# Patient Record
Sex: Female | Born: 1955 | Race: White | Hispanic: No | Marital: Married | Smoking: Never smoker
Health system: Southern US, Community
[De-identification: ages and names within clinical notes are randomized; demographics above are authoritative.]

## PROBLEM LIST (undated history)

## (undated) DIAGNOSIS — M1990 Unspecified osteoarthritis, unspecified site: Secondary | ICD-10-CM

## (undated) DIAGNOSIS — K219 Gastro-esophageal reflux disease without esophagitis: Secondary | ICD-10-CM

## (undated) DIAGNOSIS — Z87442 Personal history of urinary calculi: Secondary | ICD-10-CM

## (undated) DIAGNOSIS — Z87448 Personal history of other diseases of urinary system: Secondary | ICD-10-CM

## (undated) DIAGNOSIS — R059 Cough, unspecified: Secondary | ICD-10-CM

## (undated) DIAGNOSIS — I10 Essential (primary) hypertension: Secondary | ICD-10-CM

## (undated) DIAGNOSIS — K449 Diaphragmatic hernia without obstruction or gangrene: Secondary | ICD-10-CM

## (undated) DIAGNOSIS — R05 Cough: Secondary | ICD-10-CM

## (undated) HISTORY — PX: EXTRACORPOREAL SHOCK WAVE LITHOTRIPSY: SHX1557

---

## 2005-03-23 ENCOUNTER — Other Ambulatory Visit: Admission: RE | Admit: 2005-03-23 | Discharge: 2005-03-23 | Payer: Self-pay | Admitting: Obstetrics and Gynecology

## 2005-07-15 ENCOUNTER — Encounter: Admission: RE | Admit: 2005-07-15 | Discharge: 2005-07-15 | Payer: Self-pay | Admitting: Thoracic Surgery

## 2005-08-18 ENCOUNTER — Ambulatory Visit: Payer: Self-pay | Admitting: Internal Medicine

## 2005-09-17 ENCOUNTER — Ambulatory Visit: Payer: Self-pay | Admitting: Internal Medicine

## 2005-10-12 ENCOUNTER — Ambulatory Visit: Payer: Self-pay | Admitting: Internal Medicine

## 2005-10-20 ENCOUNTER — Ambulatory Visit: Payer: Self-pay | Admitting: Internal Medicine

## 2005-12-14 HISTORY — PX: KNEE ARTHROSCOPY: SUR90

## 2005-12-14 HISTORY — PX: LAPAROSCOPIC CHOLECYSTECTOMY: SUR755

## 2006-02-26 ENCOUNTER — Ambulatory Visit (HOSPITAL_COMMUNITY): Admission: RE | Admit: 2006-02-26 | Discharge: 2006-02-26 | Payer: Self-pay | Admitting: Internal Medicine

## 2006-03-15 ENCOUNTER — Ambulatory Visit: Payer: Self-pay | Admitting: Internal Medicine

## 2006-03-18 ENCOUNTER — Ambulatory Visit: Payer: Self-pay | Admitting: Internal Medicine

## 2007-03-25 ENCOUNTER — Other Ambulatory Visit: Admission: RE | Admit: 2007-03-25 | Discharge: 2007-03-25 | Payer: Self-pay | Admitting: Obstetrics and Gynecology

## 2008-08-15 ENCOUNTER — Other Ambulatory Visit: Admission: RE | Admit: 2008-08-15 | Discharge: 2008-08-15 | Payer: Self-pay | Admitting: Obstetrics and Gynecology

## 2010-11-06 ENCOUNTER — Emergency Department (HOSPITAL_COMMUNITY): Admission: EM | Admit: 2010-11-06 | Discharge: 2010-11-07 | Payer: Self-pay | Admitting: Emergency Medicine

## 2010-11-28 ENCOUNTER — Telehealth: Payer: Self-pay | Admitting: Internal Medicine

## 2010-11-28 ENCOUNTER — Encounter: Payer: Self-pay | Admitting: Nurse Practitioner

## 2010-12-03 ENCOUNTER — Ambulatory Visit: Payer: Self-pay | Admitting: Gastroenterology

## 2011-01-12 ENCOUNTER — Other Ambulatory Visit (HOSPITAL_COMMUNITY)
Admission: RE | Admit: 2011-01-12 | Discharge: 2011-01-12 | Disposition: A | Discharge status: Home / Self Care | Payer: BC Managed Care – PPO | Source: Ambulatory Visit | Attending: Family Medicine | Admitting: Family Medicine

## 2011-01-12 ENCOUNTER — Other Ambulatory Visit: Payer: Self-pay | Admitting: Family Medicine

## 2011-01-12 DIAGNOSIS — Z124 Encounter for screening for malignant neoplasm of cervix: Secondary | ICD-10-CM | POA: Insufficient documentation

## 2011-01-15 NOTE — Progress Notes (Signed)
Summary: Reflux  Phone Note Call from Patient Call back at [phone]   Call For: Dr Juanda Chance Summary of Call: Reflux is so bad she feels she is chocking. Wonders if she can be seen sooner than feb. PA ok Initial call taken by: Leanor Kail [facility],  November 28, 2010 2:18 PM  Follow-up for Phone Call        Message left for patient to call back.Jesse Fall RN  November 28, 2010 2:31 PM Spoke with patient. She has been experiencing acid reflux. This started in August. She saw her PCP and has not gotten better. States chest feels tight and can't breath at times. This is worse after eating. States she has a lot of acid in her throat. Takes Prilosec two times a day without relief. Scheduled patient with Willette Cluster, RNP on 12/03/10 at 9 AM. New patient letter sent. Follow-up by: Jesse Fall RN,  November 28, 2010 2:56 PM

## 2011-01-15 NOTE — Letter (Signed)
Summary: New Patient letter  [facility]  [address] [place], [place] [address]   Phone: [phone]  Fax: [phone]       11/28/2010 MRN: [id number]  Morgan Anthony [address] [place], [place]  [address]  Dear Ms. Morgan Anthony,  Welcome to the Gastroenterology Division at [facility].    You are scheduled to see Willette Cluster, RN on 12/03/10 at  9:00 AM  on the 3rd floor at [facility], [address]  We ask that you try to arrive at our office 15 minutes prior to your appointment time to allow for check-in.  We would like you to complete the enclosed self-administered evaluation form prior to your visit and bring it with you on the day of your appointment.  We will review it with you.  Also, please bring a complete list of all your medications or, if you prefer, bring the medication bottles and we will list them.  Please bring your insurance card so that we may make a copy of it.  If your insurance requires a referral to see a specialist, please bring your referral form from your primary care physician.  Co-payments are due at the time of your visit and may be paid by cash, check or credit card.     Your office visit will consist of a consult with your physician (includes a physical exam), any laboratory testing he/she may order, scheduling of any necessary diagnostic testing (e.g. x-ray, ultrasound, CT-scan), and scheduling of a procedure (e.g. Endoscopy, Colonoscopy) if required.  Please allow enough time on your schedule to allow for any/all of these possibilities.    If you cannot keep your appointment, please call [phone] to cancel or reschedule prior to your appointment date.  This allows [place] the opportunity to schedule an appointment for another patient in need of care.  If you do not cancel or reschedule by 5 p.m. the business day prior to your appointment date, you will be charged a $50.00 late cancellation/no-show fee.    Thank you for  choosing [facility] for your medical needs.  We appreciate the opportunity to care for you.  Please visit [place] at our website  to learn more about our practice.                     Sincerely,                                                             The Gastroenterology Division

## 2011-01-15 NOTE — Procedures (Signed)
Summary: Colonoscopy   Patient Name: Morgan Anthony, Morgan Anthony. MRN:  Procedure Procedures: Colonoscopy CPT: 2175713837.  Personnel: Endoscopist: Dora L. Juanda Chance, MD.  Exam Location: Exam performed in Outpatient Clinic. Outpatient  Patient Consent: Procedure, Alternatives, Risks and Benefits discussed, consent obtained, from patient. Consent was obtained by the RN.  Indications Symptoms: Abdominal pain / bloating.  History  Current Medications: Patient is not currently taking Coumadin.  Pre-Exam Physical: Performed Sep 17, 2005. Entire physical exam was normal.  Exam Exam: Extent of exam reached: Cecum, extent intended: Cecum.  The cecum was identified by appendiceal orifice and IC valve. Colon retroflexion performed. Images taken. ASA Classification: I. Tolerance: good.  Monitoring: Pulse and BP monitoring, Oximetry used. Supplemental O2 given.  Colon Prep Used Miralax for colon prep. Prep results: good.  Sedation Meds: Patient assessed and found to be appropriate for moderate (conscious) sedation. Fentanyl 50 mcg. given IV. Versed 7 mg. given IV.  Findings - NORMAL EXAM: Cecum.  - NORMAL EXAM: Rectum.   Assessment Normal examination.  Comments: abd pain has resolved after pt put on Nexiem 40 mg qd, pain subsided after 2 days Events  Unplanned Interventions: No intervention was required.  Unplanned Events: There were no complications. Plans Patient Education: Patient given standard instructions for: Yearly hemoccult testing recommended. Patient instructed to get routine colonoscopy every 10 years.  Comments: follow up with Dr Kevan Ny Disposition: After procedure patient sent to recovery. After recovery patient sent home.   cc:  Duncan Dull, MD  This report was created from the original endoscopy report, which was reviewed and signed by the above listed endoscopist.

## 2011-01-15 NOTE — Procedures (Signed)
Summary: EGD   Patient Name: Morgan Anthony, Morgan Anthony. MRN:  Procedure Procedures: Panendoscopy (EGD) CPT: 43235.    with biopsy(s)/brushing(s). CPT: D1846139.  Personnel: Endoscopist: Dora L. Juanda Chance, MD.  Exam Location: Exam performed in Outpatient Clinic. Outpatient  Patient Consent: Procedure, Alternatives, Risks and Benefits discussed, consent obtained, from patient. Consent was obtained by the RN.  Indications  Abnormal Exams, Studies: Xray, abnormal, do not suspect malignancy.  Symptoms: Abdominal pain, location: RUQ. Reflux symptoms for 1-5 yrs,  History  Current Medications: Patient is not currently taking Coumadin.  Pre-Exam Physical: Performed Mar 18, 2006  Entire physical exam was normal.  Comments: Pt. history reviewed/updated, physical exam performed prior to initiation of sedation? Exam Exam Info: Maximum depth of insertion Duodenum, intended Duodenum. Vocal cords visualized. Gastric retroflexion performed. Images taken. ASA Classification: I. Tolerance: excellent.  Sedation Meds: Patient assessed and found to be appropriate for moderate (conscious) sedation. Fentanyl 50 mcg. given IV. Versed 5 mg. given IV. Cetacaine Spray 2 sprays given aerosolized.  Monitoring: BP and pulse monitoring done. Oximetry used. Supplemental O2 given  Findings - HIATAL HERNIA: Diaphragm 40 cm from mouth. Z-line/GE Junction 35 cm from mouth. Regular, 5 cms. in length. nonreducible. ICD9: Hernia, Hiatal: 553.3. DIAGNOSTIC TEST: from Antrum. RUT done, results pending  Normal: Duodenal Apex.   Assessment Abnormal examination, see findings above.  Diagnoses: 553.3: Hernia, Hiatal.   Comments: moderate size hiatal hernia, no other structural abnormality Events  Unplanned Intervention: No unplanned interventions were required.  Unplanned Events: There were no complications. Plans Medication(s): Await pathology. PPI: Esomeprazole/Nexium 40 mg QD, starting Mar 18, 2006    Comments: nothing to explain RMQ abd. pain,  abnormal HIDA scan with decreased ejection fraction and  reproduction of pt's pain with fatty meal, refer to a surgeon Disposition: After procedure patient sent to recovery. After recovery patient sent home.   cc: Duncan Dull, MD       Lorne Skeens. Hoxworth, MD  This report was created from the original endoscopy report, which was reviewed and signed by the above listed endoscopist.

## 2011-02-24 LAB — POCT CARDIAC MARKERS: Troponin i, poc: <0.05 ng/mL (ref 0.00–0.09)

## 2011-02-24 LAB — POCT I-STAT, CHEM 8
Calcium, Ion: 1.03 mmol/L — ABNORMAL LOW (ref 1.12–1.32)
Glucose, Bld: 115 mg/dL — ABNORMAL HIGH (ref 70–99)
HCT: 45 % (ref 36.0–46.0)
Hemoglobin: 15.3 g/dL — ABNORMAL HIGH (ref 12.0–15.0)
TCO2: 21 mmol/L (ref 0–100)

## 2011-02-24 LAB — DIFFERENTIAL
Basophils Relative: 0 % (ref 0–1)
Eosinophils Absolute: 0.1 10*3/uL (ref 0.0–0.7)
Eosinophils Relative: 2 % (ref 0–5)
Monocytes Absolute: 0.5 10*3/uL (ref 0.1–1.0)
Monocytes Relative: 8 % (ref 3–12)

## 2011-02-24 LAB — CBC
HCT: 43.1 % (ref 36.0–46.0)
Hemoglobin: 15.2 g/dL — ABNORMAL HIGH (ref 12.0–15.0)
MCH: 31.1 pg (ref 26.0–34.0)
MCHC: 35.3 g/dL (ref 30.0–36.0)

## 2014-03-19 ENCOUNTER — Other Ambulatory Visit (HOSPITAL_COMMUNITY)
Admission: RE | Admit: 2014-03-19 | Discharge: 2014-03-19 | Disposition: A | Discharge status: Home / Self Care | Payer: BC Managed Care – PPO | Source: Ambulatory Visit | Attending: Family Medicine | Admitting: Family Medicine

## 2014-03-19 ENCOUNTER — Other Ambulatory Visit: Payer: Self-pay | Admitting: Family Medicine

## 2014-03-19 DIAGNOSIS — Z124 Encounter for screening for malignant neoplasm of cervix: Secondary | ICD-10-CM | POA: Insufficient documentation

## 2014-03-19 DIAGNOSIS — Z1151 Encounter for screening for human papillomavirus (HPV): Secondary | ICD-10-CM | POA: Insufficient documentation

## 2014-09-28 ENCOUNTER — Other Ambulatory Visit (HOSPITAL_COMMUNITY)
Admission: RE | Admit: 2014-09-28 | Discharge: 2014-09-28 | Disposition: A | Discharge status: Home / Self Care | Payer: BC Managed Care – PPO | Source: Ambulatory Visit | Attending: Family Medicine | Admitting: Family Medicine

## 2014-09-28 ENCOUNTER — Other Ambulatory Visit: Payer: Self-pay | Admitting: Family Medicine

## 2014-09-28 DIAGNOSIS — Z124 Encounter for screening for malignant neoplasm of cervix: Secondary | ICD-10-CM | POA: Diagnosis not present

## 2014-10-02 LAB — CYTOLOGY - PAP

## 2015-03-21 ENCOUNTER — Other Ambulatory Visit: Payer: Self-pay | Admitting: Family Medicine

## 2015-03-22 LAB — CYTOLOGY - PAP

## 2015-07-19 ENCOUNTER — Other Ambulatory Visit: Payer: Self-pay | Admitting: Urology

## 2015-07-24 ENCOUNTER — Encounter (HOSPITAL_COMMUNITY): Payer: Self-pay | Admitting: *Deleted

## 2015-07-24 NOTE — H&P (Deleted)
History of Present Illness F/u - PCP Dr. Darcus Austin     1 - urethritis - h/o urethral dilation up to 30 Fr. These are no longer performed. She also has a history of urethritis. She self treats with trimethoprim x 1 maybe every 1-2 weeks.   -2014 - no bothersome LUTS. One UTI tx with abx. PVR 0 ml.   -Jun 2016 - self treats with TMP 1-2 weekly depending on sxs of "itching". Is using Vagifem twice weekly. No UTI in 2 yrs.     2- MH - Jun 2016 - MH noted on UA assoc with low back pain radiating into right hip/groin but feels this is related to picking up grandson. Hx of stone > 20 yrs ago. Denies gross hematuria.       August 2016 interval history  The patient returns and continued management of microscopic hematuria associated with back pain. She underwent CT hematuria protocol which revealed an 11 mm right UPJ to right lower pole stone (11 mm, HU 1048, SSD 8.8 cm, visible on scout). There is also 6.6 cm simple right upper pole cyst.    Today, the pain in her back is improved after orthopedic injection of steroid but she still has some occasional right flank pain. She's had no gross hematuria or dysuria.     Past Medical History Problems  1. History of Arthritis 2. History of heartburn (Z87.898) 3. History of hypertension (Z86.79) 4. History of kidney stones (N46.270)  Surgical History Problems  1. History of Gallbladder Surgery 2. History of Knee Surgery 3. History of Lithotripsy  Current Meds 1. ALPRAZolam 0.5 MG Oral Tablet; TAKE 1 TABLET EVERY 8 HOURS AS NEEDED FOR  ANXIETY;  Therapy: 11Oct2012 to Recorded 2. Atenolol 50 MG Oral Tablet;  Therapy: (Recorded:21Jun2016) to Recorded 3. Celecoxib 200 MG Oral Capsule;  Therapy: (Recorded:21Jun2016) to Recorded 4. Cephalexin 500 MG Oral Capsule; TAKE 1 CAPSULE Daily;  Therapy: 21Jun2016 to (Evaluate:01Jul2016)  Requested for: 21Jun2016; Last  Rx:21Jun2016 Ordered 5. NexIUM 40 MG Oral Capsule Delayed Release;  Therapy: (Recorded:02Jan2013) to Recorded 6. Vagifem 10 MCG Vaginal Tablet;  Therapy: (Recorded:21Jun2016) to Recorded 7. Vitamin D 50000 UNIT CAPS;  Therapy: (Recorded:21Jun2016) to Recorded  Allergies Medication  1. Cozaar TABS 2. Trimethoprim TABS  Family History Problems  1. Family history of Death of family member : Father 2. Family history of Family Health Status Number Of Children   3 sons 3. Family history of arthritis (Z82.61) : Mother 4. Family history of hypertension (Z82.49) : Mother, Father 5. Family history of pulmonary fibrosis (Z83.6) : Father 6. Family history of Melanoma : Father  Social History Problems  1. Denied: History of Alcohol use 2. Caffeine Use   2 3. Marital History - Currently Married 4. Never A Smoker 5. Retired  Engineer, site Vital Signs [Data Includes: Last 1 Day]  Recorded: 04Aug2016 03:07PM  Blood Pressure: 133 / 88 Temperature: 97.5 F Heart Rate: 78  Physical Exam Constitutional: Well nourished and well developed . No acute distress.  Pulmonary: No respiratory distress and normal respiratory rhythm and effort.  Cardiovascular: Heart rate and rhythm are normal . No peripheral edema.  Neuro/Psych:. Mood and affect are appropriate.    Results/Data Urine [Data Includes: Last 1 Day]   [id number]  COLOR YELLOW   APPEARANCE CLEAR   SPECIFIC GRAVITY 1.015   pH 6.0   GLUCOSE NEGATIVE   BILIRUBIN NEGATIVE   KETONE NEGATIVE   BLOOD 3+   PROTEIN NEGATIVE   NITRITE NEGATIVE  LEUKOCYTE ESTERASE NEGATIVE   SQUAMOUS EPITHELIAL/HPF 0-5 HPF  WBC NONE SEEN WBC/HPF  RBC 10-20 RBC/HPF  BACTERIA NONE SEEN HPF  CRYSTALS NONE SEEN HPF  CASTS NONE SEEN LPF  Yeast NONE SEEN HPF   Old records or history reviewed:[name] Kitchen  The following images/tracing/specimen were independently visualized: [name] Kitchen    Assessment Assessed  1. Microscopic hematuria (R31.2) 2. Nephrolithiasis (N20.0) 3. Urethritis (N34.2)  Plan Health Maintenance  1. UA With REFLEX; [Do  Not Release]; Status:Complete;   Done: [id number] 02:55PM Nephrolithiasis  2. Follow-up Schedule Surgery Office  Follow-up  Status: Hold For - Appointment   Requested for: 04Aug2016 Urethritis  3. Start: Cephalexin 250 MG Oral Capsule; Take 1 capsule twice a day for 3 days when  necessary dysuria 4. URINE CULTURE; Status:Hold For - Specimen/Data Collection,Appointment; Requested  for:04Aug2016;   Discussion/Summary Nephrolithiasis-I reviewed with the patient the nature risks benefits of continued surveillance, right extracorporeal shockwave lithotripsy her right ureteroscopy. All questions answered. She had shockwave lithotripsy in the past would like to proceed with that again. She did say she passed a rather large fragment last time which made her "sick". We did discuss risk of bleeding infection and failure to fragment, failure to pass fragments, need for staged procedures among others. All questions answered. Given the stone size location and Hounsfield units as well as the skin to stone distance I believe she has a good chance of success with shockwave lithotripsy. Urine sent for culture as a precaution.    Urethritis is-refilled cephalexin 250 mg and instructed to take minimal amount. Notify me if she gets symptoms that don't resolve.    Microscopic hematuria-likely related to renal stone but discussed importance of follow-up in the long run to ensure hematuria resolves. She may need cystoscopy.

## 2015-07-25 ENCOUNTER — Ambulatory Visit (HOSPITAL_COMMUNITY): Payer: BC Managed Care – PPO

## 2015-07-25 ENCOUNTER — Ambulatory Visit (HOSPITAL_COMMUNITY)
Admission: RE | Admit: 2015-07-25 | Discharge: 2015-07-25 | Disposition: A | Discharge status: Home / Self Care | Payer: BC Managed Care – PPO | Source: Ambulatory Visit | Attending: Urology | Admitting: Urology

## 2015-07-25 ENCOUNTER — Encounter (HOSPITAL_COMMUNITY): Payer: Self-pay | Admitting: *Deleted

## 2015-07-25 ENCOUNTER — Encounter (HOSPITAL_COMMUNITY)
Admission: RE | Disposition: A | Discharge status: Home / Self Care | Payer: Self-pay | Source: Ambulatory Visit | Attending: Urology

## 2015-07-25 DIAGNOSIS — N342 Other urethritis: Secondary | ICD-10-CM | POA: Insufficient documentation

## 2015-07-25 DIAGNOSIS — Z888 Allergy status to other drugs, medicaments and biological substances status: Secondary | ICD-10-CM | POA: Insufficient documentation

## 2015-07-25 DIAGNOSIS — N200 Calculus of kidney: Secondary | ICD-10-CM | POA: Diagnosis not present

## 2015-07-25 DIAGNOSIS — I10 Essential (primary) hypertension: Secondary | ICD-10-CM | POA: Insufficient documentation

## 2015-07-25 DIAGNOSIS — R109 Unspecified abdominal pain: Secondary | ICD-10-CM | POA: Diagnosis not present

## 2015-07-25 DIAGNOSIS — Z87442 Personal history of urinary calculi: Secondary | ICD-10-CM | POA: Insufficient documentation

## 2015-07-25 DIAGNOSIS — Z79899 Other long term (current) drug therapy: Secondary | ICD-10-CM | POA: Diagnosis not present

## 2015-07-25 DIAGNOSIS — R312 Other microscopic hematuria: Secondary | ICD-10-CM | POA: Diagnosis present

## 2015-07-25 HISTORY — DX: Essential (primary) hypertension: I10

## 2015-07-25 HISTORY — DX: Gastro-esophageal reflux disease without esophagitis: K21.9

## 2015-07-25 SURGERY — LITHOTRIPSY, ESWL
Anesthesia: LOCAL | Laterality: Right

## 2015-07-25 MED ORDER — SODIUM CHLORIDE 0.9 % IV SOLN
INTRAVENOUS | Status: DC
  Administered 2015-07-25: 10:00:00 via INTRAVENOUS

## 2015-07-25 MED ORDER — DIPHENHYDRAMINE HCL 25 MG PO CAPS
25.0000 mg | ORAL_CAPSULE | ORAL | Status: AC
  Administered 2015-07-25: 25 mg via ORAL
  Filled 2015-07-25: qty 1

## 2015-07-25 MED ORDER — CIPROFLOXACIN HCL 500 MG PO TABS
500.0000 mg | ORAL_TABLET | ORAL | Status: AC
  Administered 2015-07-25: 500 mg via ORAL
  Filled 2015-07-25: qty 1

## 2015-07-25 MED ORDER — DIAZEPAM 5 MG PO TABS
10.0000 mg | ORAL_TABLET | ORAL | Status: AC
  Administered 2015-07-25: 10 mg via ORAL
  Filled 2015-07-25: qty 2

## 2015-07-25 NOTE — H&P (Signed)
History of Present Illness F/u - PCP Dr. Darcus Austin     1 - urethritis - h/o urethral dilation up to 30 Fr. These are no longer performed. She also has a history of urethritis. She self treats with trimethoprim x 1 maybe every 1-2 weeks.   -2014 - no bothersome LUTS. One UTI tx with abx. PVR 0 ml.   -Jun 2016 - self treats with TMP 1-2 weekly depending on sxs of "itching". Is using Vagifem twice weekly. No UTI in 2 yrs.     2- MH - Jun 2016 - MH noted on UA assoc with low back pain radiating into right hip/groin but feels this is related to picking up grandson. Hx of stone > 20 yrs ago. Denies gross hematuria.       August 2016 interval history  The patient returns and continued management of microscopic hematuria associated with back pain. She underwent CT hematuria protocol which revealed an 11 mm right UPJ to right lower pole stone (11 mm, HU 1048, SSD 8.8 cm, visible on scout). There is also 6.6 cm simple right upper pole cyst.    Today, the pain in her back is improved after orthopedic injection of steroid but she still has some occasional right flank pain. She's had no gross hematuria or dysuria.     Past Medical History Problems  1. History of Arthritis 2. History of heartburn (Z87.898) 3. History of hypertension (Z86.79) 4. History of kidney stones (D32.671)  Surgical History Problems  1. History of Gallbladder Surgery 2. History of Knee Surgery 3. History of Lithotripsy  Current Meds 1. ALPRAZolam 0.5 MG Oral Tablet; TAKE 1 TABLET EVERY 8 HOURS AS NEEDED FOR  ANXIETY;  Therapy: 11Oct2012 to Recorded 2. Atenolol 50 MG Oral Tablet;  Therapy: (Recorded:21Jun2016) to Recorded 3. Celecoxib 200 MG Oral Capsule;  Therapy: (Recorded:21Jun2016) to Recorded 4. Cephalexin 500 MG Oral Capsule; TAKE 1 CAPSULE Daily;  Therapy: 21Jun2016 to (Evaluate:01Jul2016)  Requested for: 21Jun2016; Last  Rx:21Jun2016 Ordered 5. NexIUM 40 MG Oral Capsule Delayed Release;  Therapy: (Recorded:02Jan2013) to Recorded 6. Vagifem 10 MCG Vaginal Tablet;  Therapy: (Recorded:21Jun2016) to Recorded 7. Vitamin D 50000 UNIT CAPS;  Therapy: (Recorded:21Jun2016) to Recorded  Allergies Medication  1. Cozaar TABS 2. Trimethoprim TABS  Family History Problems  1. Family history of Death of family member : Father 2. Family history of Family Health Status Number Of Children   3 sons 3. Family history of arthritis (Z82.61) : Mother 4. Family history of hypertension (Z82.49) : Mother, Father 5. Family history of pulmonary fibrosis (Z83.6) : Father 6. Family history of Melanoma : Father  Social History Problems  1. Denied: History of Alcohol use 2. Caffeine Use   2 3. Marital History - Currently Married 4. Never A Smoker 5. Retired  Engineer, site Vital Signs [Data Includes: Last 1 Day]  Recorded: 04Aug2016 03:07PM  Blood Pressure: 133 / 88 Temperature: 97.5 F Heart Rate: 78  Physical Exam Constitutional: Well nourished and well developed . No acute distress.  Pulmonary: No respiratory distress and normal respiratory rhythm and effort.  Cardiovascular: Heart rate and rhythm are normal . No peripheral edema.  Neuro/Psych:. Mood and affect are appropriate.    Results/Data Urine [Data Includes: Last 1 Day]   [id number]  COLOR YELLOW   APPEARANCE CLEAR   SPECIFIC GRAVITY 1.015   pH 6.0   GLUCOSE NEGATIVE   BILIRUBIN NEGATIVE   KETONE NEGATIVE   BLOOD 3+   PROTEIN NEGATIVE   NITRITE NEGATIVE  LEUKOCYTE ESTERASE NEGATIVE   SQUAMOUS EPITHELIAL/HPF 0-5 HPF  WBC NONE SEEN WBC/HPF  RBC 10-20 RBC/HPF  BACTERIA NONE SEEN HPF  CRYSTALS NONE SEEN HPF  CASTS NONE SEEN LPF  Yeast NONE SEEN HPF   Old records or history reviewed:[name] Kitchen  The following images/tracing/specimen were independently visualized: [name] Kitchen    Assessment Assessed  1. Microscopic hematuria (R31.2) 2. Nephrolithiasis (N20.0) 3. Urethritis (N34.2)  Plan Health Maintenance  1. UA With REFLEX; [Do  Not Release]; Status:Complete;   Done: [id number] 02:55PM Nephrolithiasis  2. Follow-up Schedule Surgery Office  Follow-up  Status: Hold For - Appointment   Requested for: 04Aug2016 Urethritis  3. Start: Cephalexin 250 MG Oral Capsule; Take 1 capsule twice a day for 3 days when  necessary dysuria 4. URINE CULTURE; Status:Hold For - Specimen/Data Collection,Appointment; Requested  for:04Aug2016;   Discussion/Summary Nephrolithiasis-I reviewed with the patient the nature risks benefits of continued surveillance, right extracorporeal shockwave lithotripsy her right ureteroscopy. All questions answered. She had shockwave lithotripsy in the past would like to proceed with that again. She did say she passed a rather large fragment last time which made her "sick". We did discuss risk of bleeding infection and failure to fragment, failure to pass fragments, need for staged procedures among others. All questions answered. Given the stone size location and Hounsfield units as well as the skin to stone distance I believe she has a good chance of success with shockwave lithotripsy. Urine sent for culture as a precaution.    Urethritis is-refilled cephalexin 250 mg and instructed to take minimal amount. Notify me if she gets symptoms that don't resolve.    Microscopic hematuria-likely related to renal stone but discussed importance of follow-up in the long run to ensure hematuria resolves. She may need cystoscopy.

## 2015-07-25 NOTE — Op Note (Signed)
Refer to [facility] Op Note scanned in the chart 

## 2015-10-02 ENCOUNTER — Encounter: Payer: Self-pay | Admitting: Gastroenterology

## 2016-03-13 IMAGING — CR DG ABDOMEN 1V
1 series · 1 of 1 positions shown · non-contrast
Comparison: CT scan dated 06/12/2015

CLINICAL DATA: Right kidney stone.

EXAM:
ABDOMEN - 1 VIEW

[t abdomen supine]
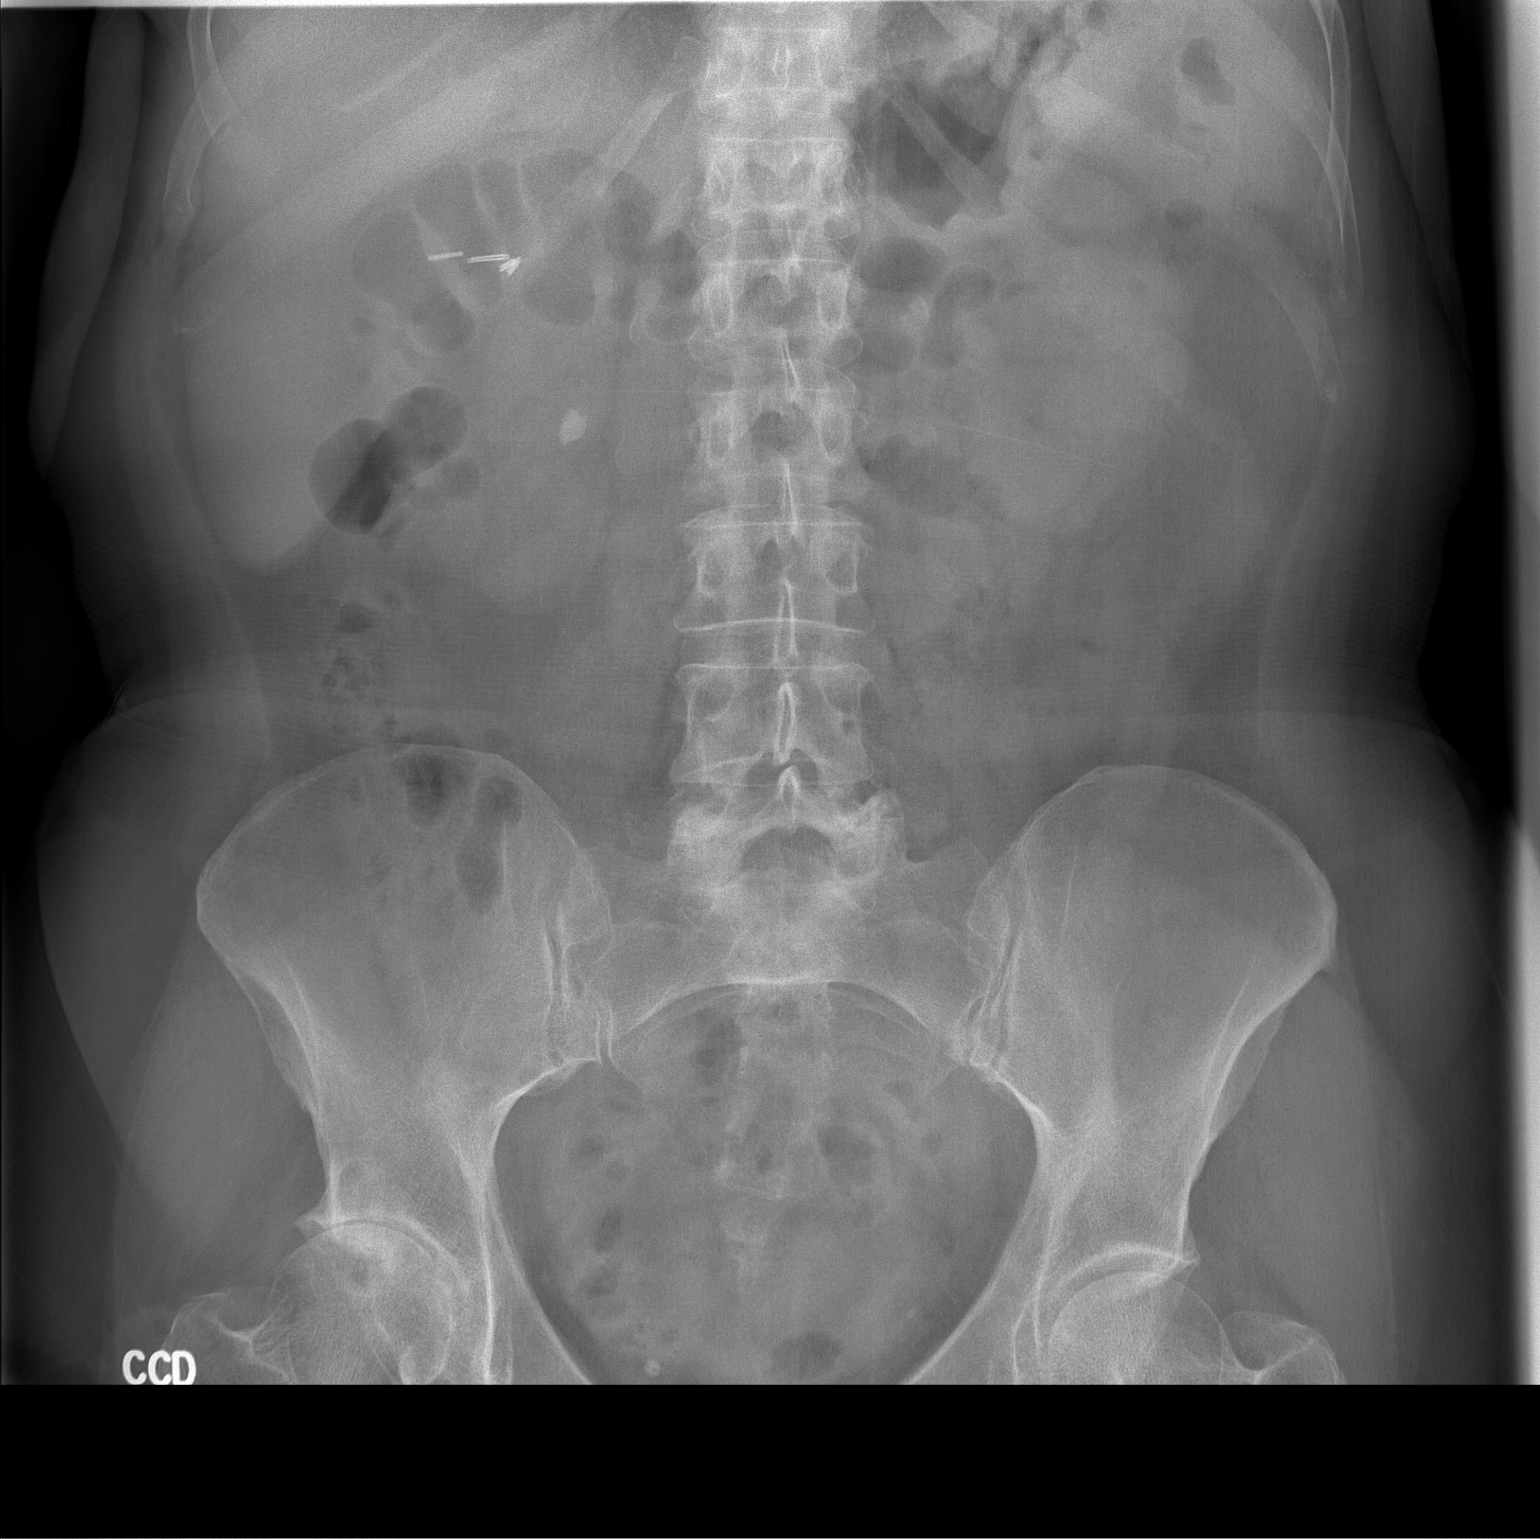

[1 of 1 positions shown; findings below may reference images not displayed]

FINDINGS: 10 mm stone is again noted in the right renal pelvis. Phleboliths in
the pelvis. Bowel gas pattern is normal. Surgical clips from
previous cholecystectomy.

Moderate arthritis of the right hip.  Facet arthritis at L5-S1.
IMPRESSION: 10 mm stone in the right renal pelvis, unchanged.

## 2018-06-20 ENCOUNTER — Other Ambulatory Visit: Payer: Self-pay | Admitting: Family Medicine

## 2018-06-20 ENCOUNTER — Other Ambulatory Visit (HOSPITAL_COMMUNITY)
Admission: RE | Admit: 2018-06-20 | Discharge: 2018-06-20 | Disposition: A | Discharge status: Home / Self Care | Payer: BC Managed Care – PPO | Source: Ambulatory Visit | Attending: Family Medicine | Admitting: Family Medicine

## 2018-06-20 DIAGNOSIS — Z01411 Encounter for gynecological examination (general) (routine) with abnormal findings: Secondary | ICD-10-CM | POA: Diagnosis not present

## 2018-06-21 LAB — CYTOLOGY - PAP: Diagnosis: NEGATIVE

## 2018-10-17 ENCOUNTER — Encounter: Payer: Self-pay | Admitting: Gastroenterology

## 2018-12-05 ENCOUNTER — Encounter: Payer: BC Managed Care – PPO | Admitting: Gastroenterology

## 2018-12-19 ENCOUNTER — Encounter: Payer: Self-pay | Admitting: Gastroenterology

## 2018-12-19 ENCOUNTER — Ambulatory Visit (AMBULATORY_SURGERY_CENTER): Payer: Self-pay

## 2018-12-19 VITALS — Ht 63.0 in | Wt 142.0 lb

## 2018-12-19 DIAGNOSIS — Z1211 Encounter for screening for malignant neoplasm of colon: Secondary | ICD-10-CM

## 2018-12-19 MED ORDER — NA SULFATE-K SULFATE-MG SULF 17.5-3.13-1.6 GM/177ML PO SOLN: 1.0000 | Freq: Once | ORAL | 0 refills | Status: AC

## 2018-12-19 NOTE — Progress Notes (Signed)
Denies allergies to eggs or soy products. Denies complication of anesthesia or sedation. Denies use of weight loss medication. Denies use of O2.   Emmi instructions declined.   Patient was given a 15.00 coupon for Suprep.

## 2019-01-02 ENCOUNTER — Encounter: Payer: Self-pay | Admitting: Gastroenterology

## 2019-01-02 ENCOUNTER — Ambulatory Visit (AMBULATORY_SURGERY_CENTER): Payer: BC Managed Care – PPO | Admitting: Gastroenterology

## 2019-01-02 VITALS — BP 135/69 | HR 58 | Temp 98.0°F | Resp 17 | Ht 63.0 in | Wt 142.0 lb

## 2019-01-02 DIAGNOSIS — K635 Polyp of colon: Secondary | ICD-10-CM

## 2019-01-02 DIAGNOSIS — Z1211 Encounter for screening for malignant neoplasm of colon: Secondary | ICD-10-CM | POA: Diagnosis not present

## 2019-01-02 DIAGNOSIS — D124 Benign neoplasm of descending colon: Secondary | ICD-10-CM

## 2019-01-02 MED ORDER — SODIUM CHLORIDE 0.9 % IV SOLN: 500.0000 mL | Freq: Once | INTRAVENOUS | Status: DC

## 2019-01-02 NOTE — Op Note (Signed)
Morgan Anthony Patient Name: Morgan Anthony Procedure Date: 01/02/2019 9:59 AM MRN: [id number] Endoscopist: Remo Lipps P. Havery Moros , MD Age: 63 Referring MD:  Date of Birth: 07-Oct-1956 Gender: Female Account #: [id number] Procedure:                Colonoscopy Indications:              Screening for colorectal malignant neoplasm Medicines:                Monitored Anesthesia Care Procedure:                Pre-Anesthesia Assessment:                           - Prior to the procedure, a History and Physical                            was performed, and patient medications and                            allergies were reviewed. The patient's tolerance of                            previous anesthesia was also reviewed. The risks                            and benefits of the procedure and the sedation                            options and risks were discussed with the patient.                            All questions were answered, and informed consent                            was obtained. Prior Anticoagulants: The patient has                            taken no previous anticoagulant or antiplatelet                            agents. ASA Grade Assessment: II - A patient with                            mild systemic disease. After reviewing the risks                            and benefits, the patient was deemed in                            satisfactory condition to undergo the procedure.                           After obtaining informed consent, the colonoscope  was passed under direct vision. Throughout the                            procedure, the patient's blood pressure, pulse, and                            oxygen saturations were monitored continuously. The                            Model PCF-H190DL [id number]) scope was introduced                            through the anus and advanced to the the cecum,                            identified by  appendiceal orifice and ileocecal                            valve. The colonoscopy was performed without                            difficulty. The patient tolerated the procedure                            well. The quality of the bowel preparation was                            good. The ileocecal valve, appendiceal orifice, and                            rectum were photographed. Scope In: 10:11:13 AM Scope Out: 10:24:29 AM Scope Withdrawal Time: 0 hours 10 minutes 43 seconds  Total Procedure Duration: 0 hours 13 minutes 16 seconds  Findings:                 The perianal and digital rectal examinations were                            normal.                           A 3 mm polyp was found in the descending colon. The                            polyp was flat. The polyp was removed with a cold                            snare. Resection and retrieval were complete.                           A few small-mouthed diverticula were found in the                            sigmoid colon.  The exam was otherwise without abnormality. Complications:            No immediate complications. Estimated blood loss:                            Minimal. Estimated Blood Loss:     Estimated blood loss was minimal. Impression:               - One 3 mm polyp in the descending colon, removed                            with a cold snare. Resected and retrieved.                           - Diverticulosis in the sigmoid colon.                           - The examination was otherwise normal. Recommendation:           - Patient has a contact number available for                            emergencies. The signs and symptoms of potential                            delayed complications were discussed with the                            patient. Return to normal activities tomorrow.                            Written discharge instructions were provided to the                             patient.                           - Resume previous diet.                           - Continue present medications.                           - Await pathology results. Remo Lipps P. Frankey Botting, MD 01/02/2019 10:35:43 AM This report has been signed electronically.

## 2019-01-02 NOTE — Progress Notes (Signed)
Called to room to assist during endoscopic procedure.  Patient ID and intended procedure confirmed with present staff. Received instructions for my participation in the procedure from the performing physician.  

## 2019-01-02 NOTE — Progress Notes (Signed)
Report to PACU, RN, vss, BBS= Clear.  

## 2019-01-02 NOTE — Patient Instructions (Signed)
Impression/Recommendations:  [place] handout given to patient. Diverticulosis handout given to patient.  Resume previous diet. Continue present medications.  Await pathology results.  YOU HAD AN ENDOSCOPIC PROCEDURE TODAY AT [facility]:   Refer to the procedure report that was given to you for any specific questions about what was found during the examination.  If the procedure report does not answer your questions, please call your gastroenterologist to clarify.  If you requested that your care partner not be given the details of your procedure findings, then the procedure report has been included in a sealed envelope for you to review at your convenience later.  YOU SHOULD EXPECT: Some feelings of bloating in the abdomen. Passage of more gas than usual.  Walking can help get rid of the air that was put into your GI tract during the procedure and reduce the bloating. If you had a lower endoscopy (such as a colonoscopy or flexible sigmoidoscopy) you may notice spotting of blood in your stool or on the toilet paper. If you underwent a bowel prep for your procedure, you may not have a normal bowel movement for a few days.  Please Note:  You might notice some irritation and congestion in your nose or some drainage.  This is from the oxygen used during your procedure.  There is no need for concern and it should clear up in a day or so.  SYMPTOMS TO REPORT IMMEDIATELY:   Following lower endoscopy (colonoscopy or flexible sigmoidoscopy):  Excessive amounts of blood in the stool  Significant tenderness or worsening of abdominal pains  Swelling of the abdomen that is new, acute  Fever of 100F or higher For urgent or emergent issues, a gastroenterologist can be reached at any hour by calling [phone].   DIET:  We do recommend a small meal at first, but then you may proceed to your regular diet.  Drink plenty of fluids but you should avoid alcoholic beverages for 24  hours.  ACTIVITY:  You should plan to take it easy for the rest of today and you should NOT DRIVE or use heavy machinery until tomorrow (because of the sedation medicines used during the test).    FOLLOW UP: Our staff will call the number listed on your records the next business day following your procedure to check on you and address any questions or concerns that you may have regarding the information given to you following your procedure. If we do not reach you, we will leave a message.  However, if you are feeling well and you are not experiencing any problems, there is no need to return our call.  We will assume that you have returned to your regular daily activities without incident.  If any biopsies were taken you will be contacted by phone or by letter within the next 1-3 weeks.  Please call us at [phone] if you have not heard about the biopsies in 3 weeks.    SIGNATURES/CONFIDENTIALITY: You and/or your care partner have signed paperwork which will be entered into your electronic medical record.  These signatures attest to the fact that that the information above on your After Visit Summary has been reviewed and is understood.  Full responsibility of the confidentiality of this discharge information lies with you and/or your care-partner.

## 2019-01-03 ENCOUNTER — Telehealth: Payer: Self-pay

## 2019-01-03 NOTE — Telephone Encounter (Signed)
  Follow up Call-  Call back number 01/02/2019  Post procedure Call Back phone  # [phone]  Permission to leave phone message Yes  Some recent data might be hidden     Patient questions:  Do you have a fever, pain , or abdominal swelling? No. Pain Score  0 *  Have you tolerated food without any problems? Yes.    Have you been able to return to your normal activities? Yes.    Do you have any questions about your discharge instructions: Diet   No. Medications  No. Follow up visit  No.  Do you have questions or concerns about your Care? No.  Actions: * If pain score is 4 or above: No action needed, pain <4.

## 2019-01-10 ENCOUNTER — Encounter: Payer: Self-pay | Admitting: Gastroenterology

## 2019-01-14 HISTORY — PX: COLONOSCOPY WITH PROPOFOL: SHX5780

## 2019-01-18 ENCOUNTER — Ambulatory Visit (INDEPENDENT_AMBULATORY_CARE_PROVIDER_SITE_OTHER): Payer: BC Managed Care – PPO | Admitting: Orthopaedic Surgery

## 2019-01-18 ENCOUNTER — Ambulatory Visit (INDEPENDENT_AMBULATORY_CARE_PROVIDER_SITE_OTHER): Payer: Self-pay

## 2019-01-18 DIAGNOSIS — M1611 Unilateral primary osteoarthritis, right hip: Secondary | ICD-10-CM | POA: Diagnosis not present

## 2019-01-18 DIAGNOSIS — M25551 Pain in right hip: Secondary | ICD-10-CM

## 2019-01-18 NOTE — Progress Notes (Signed)
Office Visit Note   Patient: Morgan Anthony           Date of Birth: 12-19-1955           MRN: [id number] Visit Date: 01/18/2019              Requested by: Chesley Noon, MD [place], [place] [address] PCP: Chesley Noon, MD   Assessment & Plan: Visit Diagnoses:  1. Pain in right hip   2. Unilateral primary osteoarthritis, right hip     Plan: I went over the patient's x-rays in detail with her.  Also her clinical exam findings.  I do feel that she has end-stage arthritis and needs a hip replacement surgery at this point as the definitive treatment for her pain and stiffness.  She understands that as well.  She has put up with this for many years now and is getting towards totally detrimentally affecting her quality of life.  She takes care of grandkids daily.  She needs to be more active and have less pain.  Given the severity of her x-ray findings I do feel a total hip arthroplasty is warranted of the right hip.  I showed her hip model explained in detail with the surgery involves.  We talked about the risk and benefits of surgery.  We discussed intraoperative and postoperative course and what to expect.  She is interested in having this scheduled soon.  All questions concerns were answered and addressed.  Follow-Up Instructions: Return for 2 weeks post-op.   Orders:  Orders Placed This Encounter  Procedures  . XR HIP UNILAT W OR W/O PELVIS 1V RIGHT   No orders of the defined types were placed in this encounter.     Procedures: No procedures performed   Clinical Data: No additional findings.   Subjective: Chief Complaint  Patient presents with  . Right Hip - Pain  Patient is a very pleasant 63 year old female who was sent this way to evaluate and treat severe end-stage arthritis of her right hip.  Her pain is daily and it is 10 out of 10.  She gets very stiff with the right hip.  She now has trouble getting her shoes and socks on.  She does take care  of several grandkids daily and now that is affecting what she does.  Her mobility is significant limited.  She has significant pain in her groin.  At this point this is been going on for many years.  She is not a diabetic and is not a smoker.  She is petite.  She is tried and failed all forms conservative treatment including activity modification, anti-inflammatories, injections, walking with a can and rest.  HPI  Review of Systems She currently denies any headache, chest pain, shortness of breath, fever, chills, nausea, vomiting  Objective: Vital Signs: There were no vitals taken for this visit.  Physical Exam She is alert and orient x3 and in no acute distress Ortho Exam Examination of her right hip shows profound stiffness in her right hip.  She also has a leg length discrepancy with the right side shorter than left.  She has severe pain over the extremes of rotation of the right hip.  Her left hip exam is entirely normal. Specialty Comments:  No specialty comments available.  Imaging: Xr Hip Unilat W Or W/o Pelvis 1v Right  Result Date: 01/18/2019 An AP pelvis and lateral right hip shows profound end-stage arthritis of the right hip.  There is a leg length discrepancy with the right side shorter than left.  There is complete loss of the joint space.  There are sclerotic changes in the femoral head and acetabulum as well as large para-articular osteophytes and cystic changes.  There is collapse and flattening of the femoral head.    PMFS History: Patient Active Problem List   Diagnosis Date Noted  . Unilateral primary osteoarthritis, right hip 01/18/2019   Past Medical History:  Diagnosis Date  . Arthritis    knees and hips  . Chronic kidney disease    right renal stone  . GERD (gastroesophageal reflux disease)   . History of hiatal hernia   . Hypertension     Family History  Problem Relation Age of Onset  . Colon cancer Neg Hx   . Esophageal cancer Neg Hx   . Rectal  cancer Neg Hx   . Stomach cancer Neg Hx     Past Surgical History:  Procedure Laterality Date  . CESAREAN SECTION  1985  . CHOLECYSTECTOMY  2007  . [place]  . KNEE ARTHROSCOPY Bilateral 2007   Social History   Occupational History  . Not on file  Tobacco Use  . Smoking status: Never Smoker  . Smokeless tobacco: Never Used  Substance and Sexual Activity  . Alcohol use: No  . Drug use: No  . Sexual activity: Not on file

## 2019-01-31 ENCOUNTER — Other Ambulatory Visit (INDEPENDENT_AMBULATORY_CARE_PROVIDER_SITE_OTHER): Payer: Self-pay | Admitting: Physician Assistant

## 2019-02-02 ENCOUNTER — Telehealth (INDEPENDENT_AMBULATORY_CARE_PROVIDER_SITE_OTHER): Payer: Self-pay | Admitting: Orthopaedic Surgery

## 2019-02-02 NOTE — Telephone Encounter (Signed)
Needs to call her primary care doc, he won't know what to call in for her

## 2019-02-02 NOTE — Telephone Encounter (Signed)
Pt called stating she is suppose to be having surgery on the 28th. But the pt says she has a cold its in her chest and she can't get it to break up. She is wondering if Dr.Blackman can call her in something.

## 2019-02-03 NOTE — Patient Instructions (Signed)
Morgan Anthony  02/03/2019   Your procedure is scheduled on: 02-10-2019   Report to [facility] Main  Entrance    Report to admitting at 9:30AM    Call this number if you have problems the morning of surgery [phone]      Remember: Do not eat food or drink liquids :After Midnight. BRUSH YOUR TEETH MORNING OF SURGERY AND RINSE YOUR MOUTH OUT, NO CHEWING GUM CANDY OR MINTS.     Take these medicines the morning of surgery with A SIP OF WATER: ATENOLOL, ESOMEPRAZOLE, TYLENOL                                 You may not have any metal on your body including hair pins and              piercings  Do not wear jewelry, make-up, lotions, powders or perfumes, deodorant             Do not wear nail polish.  Do not shave  48 hours prior to surgery.               Do not bring valuables to the hospital. [place].  Contacts, dentures or bridgework may not be worn into surgery.  Leave suitcase in the car. After surgery it may be brought to your room.                 Please read over the following fact sheets you were given: _____________________________________________________________________             [facility] - Preparing for Surgery Before surgery, you can play an important role.  Because skin is not sterile, your skin needs to be as free of germs as possible.  You can reduce the number of germs on your skin by washing with CHG (chlorahexidine gluconate) soap before surgery.  CHG is an antiseptic cleaner which kills germs and bonds with the skin to continue killing germs even after washing. Please DO NOT use if you have an allergy to CHG or antibacterial soaps.  If your skin becomes reddened/irritated stop using the CHG and inform your nurse when you arrive at Short Stay. Do not shave (including legs and underarms) for at least 48 hours prior to the first CHG shower.  You may shave your face/neck. Please follow  these instructions carefully:  1.  Shower with CHG Soap the night before surgery and the  morning of Surgery.  2.  If you choose to wash your hair, wash your hair first as usual with your  normal  shampoo.  3.  After you shampoo, rinse your hair and body thoroughly to remove the  shampoo.                           4.  Use CHG as you would any other liquid soap.  You can apply chg directly  to the skin and wash                       Gently with a scrungie or clean washcloth.  5.  Apply the CHG Soap to your body ONLY FROM THE NECK DOWN.  Do not use on face/ open                           Wound or open sores. Avoid contact with eyes, ears mouth and genitals (private parts).                       Wash face,  Genitals (private parts) with your normal soap.             6.  Wash thoroughly, paying special attention to the area where your surgery  will be performed.  7.  Thoroughly rinse your body with warm water from the neck down.  8.  DO NOT shower/wash with your normal soap after using and rinsing off  the CHG Soap.                9.  Pat yourself dry with a clean towel.            10.  Wear clean pajamas.            11.  Place clean sheets on your bed the night of your first shower and do not  sleep with pets. Day of Surgery : Do not apply any lotions/deodorants the morning of surgery.  Please wear clean clothes to the hospital/surgery center.  FAILURE TO FOLLOW THESE INSTRUCTIONS MAY RESULT IN THE CANCELLATION OF YOUR SURGERY PATIENT SIGNATURE_________________________________  NURSE SIGNATURE__________________________________  ________________________________________________________________________   Morgan Anthony  An incentive spirometer is a tool that can help keep your lungs clear and active. This tool measures how well you are filling your lungs with each breath. Taking long deep breaths may help reverse or decrease the chance of developing breathing (pulmonary) problems  (especially infection) following:  A long period of time when you are unable to move or be active. BEFORE THE PROCEDURE   If the spirometer includes an indicator to show your best effort, your nurse or respiratory therapist will set it to a desired goal.  If possible, sit up straight or lean slightly forward. Try not to slouch.  Hold the incentive spirometer in an upright position. INSTRUCTIONS FOR USE  1. Sit on the edge of your bed if possible, or sit up as far as you can in bed or on a chair. 2. Hold the incentive spirometer in an upright position. 3. Breathe out normally. 4. Place the mouthpiece in your mouth and seal your lips tightly around it. 5. Breathe in slowly and as deeply as possible, raising the piston or the ball toward the top of the column. 6. Hold your breath for 3-5 seconds or for as long as possible. Allow the piston or ball to fall to the bottom of the column. 7. Remove the mouthpiece from your mouth and breathe out normally. 8. Rest for a few seconds and repeat Steps 1 through 7 at least 10 times every 1-2 hours when you are awake. Take your time and take a few normal breaths between deep breaths. 9. The spirometer may include an indicator to show your best effort. Use the indicator as a goal to work toward during each repetition. 10. After each set of 10 deep breaths, practice coughing to be sure your lungs are clear. If you have an incision (the cut made at the time of surgery), support your incision when coughing by placing a pillow or rolled up towels firmly against it. Once you are able to get out of  bed, walk around indoors and cough well. You may stop using the incentive spirometer when instructed by your caregiver.  RISKS AND COMPLICATIONS  Take your time so you do not get dizzy or light-headed.  If you are in pain, you may need to take or ask for pain medication before doing incentive spirometry. It is harder to take a deep breath if you are having  pain. AFTER USE  Rest and breathe slowly and easily.  It can be helpful to keep track of a log of your progress. Your caregiver can provide you with a simple table to help with this. If you are using the spirometer at home, follow these instructions: [place] IF:   You are having difficultly using the spirometer.  You have trouble using the spirometer as often as instructed.  Your pain medication is not giving enough relief while using the spirometer.  You develop fever of 100.5 F (38.1 C) or higher. SEEK IMMEDIATE MEDICAL CARE IF:   You cough up bloody sputum that had not been present before.  You develop fever of 102 F (38.9 C) or greater.  You develop worsening pain at or near the incision site. MAKE SURE YOU:   Understand these instructions.  Will watch your condition.  Will get help right away if you are not doing well or get worse. Document Released: 04/12/2007 Document Revised: 02/22/2012 Document Reviewed: 06/13/2007 [facility] Patient Information 2014 [place], [place].   ________________________________________________________________________

## 2019-02-06 ENCOUNTER — Encounter (HOSPITAL_COMMUNITY): Payer: Self-pay

## 2019-02-06 ENCOUNTER — Encounter (HOSPITAL_COMMUNITY)
Admission: RE | Admit: 2019-02-06 | Discharge: 2019-02-06 | Disposition: A | Discharge status: Home / Self Care | Payer: BC Managed Care – PPO | Source: Ambulatory Visit | Attending: Anesthesiology | Admitting: Anesthesiology

## 2019-02-14 NOTE — Patient Instructions (Signed)
Morgan Anthony  02/14/2019       Your procedure is scheduled on:  02-17-2019   Report to [facility] Main  Entrance,  Report to admitting at  8:30 AM    Call this number if you have problems the morning of surgery [phone]        Remember: Do not eat food or drink liquids :After Midnight.  This includes water, candy, gum, mints  BRUSH YOUR TEETH MORNING OF SURGERY AND RINSE YOUR MOUTH OUT        Take these medicines the morning of surgery with A SIP OF WATER:   Atenolol,  Esomeprazole (nexium)                                    You may not have any metal on your body including hair pins and piercings.             Do not wear jewelry, make-up, lotions, powders or perfumes, deodorant             Do not wear nail polish.  Do not shave  48 hours prior to surgery.        Do not bring valuables to the hospital. [place].  Contacts, dentures or bridgework may not be worn into surgery.  Leave suitcase in the car. After surgery it may be brought to your room.     _____________________________________________________________________             [facility] - Preparing for Surgery Before surgery, you can play an important role.  Because skin is not sterile, your skin needs to be as free of germs as possible.  You can reduce the number of germs on your skin by washing with CHG (chlorahexidine gluconate) soap before surgery.  CHG is an antiseptic cleaner which kills germs and bonds with the skin to continue killing germs even after washing. Please DO NOT use if you have an allergy to CHG or antibacterial soaps.  If your skin becomes reddened/irritated stop using the CHG and inform your nurse when you arrive at Short Stay. Do not shave (including legs and underarms) for at least 48 hours prior to the first CHG shower.  You may shave your face/neck. Please follow these instructions carefully:  1.   Shower with CHG Soap the night before surgery and the  morning of Surgery.  2.  If you choose to wash your hair, wash your hair first as usual with your  normal  shampoo.  3.  After you shampoo, rinse your hair and body thoroughly to remove the  shampoo.                            4.  Use CHG as you would any other liquid soap.  You can apply chg directly  to the skin and wash                       Gently with a scrungie or clean washcloth.  5.  Apply the CHG Soap to your body ONLY FROM THE NECK DOWN.   Do not use on face/ open  Wound or open sores. Avoid contact with eyes, ears mouth and genitals (private parts).                       Wash face,  Genitals (private parts) with your normal soap.             6.  Wash thoroughly, paying special attention to the area where your surgery  will be performed.  7.  Thoroughly rinse your body with warm water from the neck down.  8.  DO NOT shower/wash with your normal soap after using and rinsing off  the CHG Soap.             9.  Pat yourself dry with a clean towel.            10.  Wear clean pajamas.            11.  Place clean sheets on your bed the night of your first shower and do not  sleep with pets. Day of Surgery : Do not apply any lotions/deodorants the morning of surgery.  Please wear clean clothes to the hospital/surgery center.  FAILURE TO FOLLOW THESE INSTRUCTIONS MAY RESULT IN THE CANCELLATION OF YOUR SURGERY PATIENT SIGNATURE_________________________________  NURSE SIGNATURE__________________________________  ________________________________________________________________________   Morgan Anthony  An incentive spirometer is a tool that can help keep your lungs clear and active. This tool measures how well you are filling your lungs with each breath. Taking long deep breaths may help reverse or decrease the chance of developing breathing (pulmonary) problems (especially infection) following:  A long  period of time when you are unable to move or be active. BEFORE THE PROCEDURE   If the spirometer includes an indicator to show your best effort, your nurse or respiratory therapist will set it to a desired goal.  If possible, sit up straight or lean slightly forward. Try not to slouch.  Hold the incentive spirometer in an upright position. INSTRUCTIONS FOR USE  1. Sit on the edge of your bed if possible, or sit up as far as you can in bed or on a chair. 2. Hold the incentive spirometer in an upright position. 3. Breathe out normally. 4. Place the mouthpiece in your mouth and seal your lips tightly around it. 5. Breathe in slowly and as deeply as possible, raising the piston or the ball toward the top of the column. 6. Hold your breath for 3-5 seconds or for as long as possible. Allow the piston or ball to fall to the bottom of the column. 7. Remove the mouthpiece from your mouth and breathe out normally. 8. Rest for a few seconds and repeat Steps 1 through 7 at least 10 times every 1-2 hours when you are awake. Take your time and take a few normal breaths between deep breaths. 9. The spirometer may include an indicator to show your best effort. Use the indicator as a goal to work toward during each repetition. 10. After each set of 10 deep breaths, practice coughing to be sure your lungs are clear. If you have an incision (the cut made at the time of surgery), support your incision when coughing by placing a pillow or rolled up towels firmly against it. Once you are able to get out of bed, walk around indoors and cough well. You may stop using the incentive spirometer when instructed by your caregiver.  RISKS AND COMPLICATIONS  Take your time so you do not get dizzy or light-headed.  If you are in pain, you may need to take or ask for pain medication before doing incentive spirometry. It is harder to take a deep breath if you are having pain. AFTER USE  Rest and breathe slowly and  easily.  It can be helpful to keep track of a log of your progress. Your caregiver can provide you with a simple table to help with this. If you are using the spirometer at home, follow these instructions: [place] IF:   You are having difficultly using the spirometer.  You have trouble using the spirometer as often as instructed.  Your pain medication is not giving enough relief while using the spirometer.  You develop fever of 100.5 F (38.1 C) or higher. SEEK IMMEDIATE MEDICAL CARE IF:   You cough up bloody sputum that had not been present before.  You develop fever of 102 F (38.9 C) or greater.  You develop worsening pain at or near the incision site. MAKE SURE YOU:   Understand these instructions.  Will watch your condition.  Will get help right away if you are not doing well or get worse. Document Released: 04/12/2007 Document Revised: 02/22/2012 Document Reviewed: 06/13/2007 [facility] Patient Information 2014 [place], [place].   ________________________________________________________________________

## 2019-02-15 ENCOUNTER — Other Ambulatory Visit: Payer: Self-pay

## 2019-02-15 ENCOUNTER — Encounter (HOSPITAL_COMMUNITY): Payer: Self-pay

## 2019-02-15 ENCOUNTER — Encounter (HOSPITAL_COMMUNITY)
Admission: RE | Admit: 2019-02-15 | Discharge: 2019-02-15 | Disposition: A | Discharge status: Home / Self Care | Payer: BC Managed Care – PPO | Source: Ambulatory Visit | Attending: Orthopaedic Surgery | Admitting: Orthopaedic Surgery

## 2019-02-15 DIAGNOSIS — Z01818 Encounter for other preprocedural examination: Secondary | ICD-10-CM | POA: Diagnosis not present

## 2019-02-15 HISTORY — DX: Cough, unspecified: R05.9

## 2019-02-15 HISTORY — DX: Personal history of other diseases of urinary system: Z87.448

## 2019-02-15 HISTORY — DX: Cough: R05

## 2019-02-15 HISTORY — DX: Unspecified osteoarthritis, unspecified site: M19.90

## 2019-02-15 HISTORY — DX: Diaphragmatic hernia without obstruction or gangrene: K44.9

## 2019-02-15 HISTORY — DX: Personal history of urinary calculi: Z87.442

## 2019-02-15 LAB — BASIC METABOLIC PANEL
Anion gap: 9 (ref 5–15)
BUN: 28 mg/dL — ABNORMAL HIGH (ref 8–23)
CALCIUM: 10 mg/dL (ref 8.9–10.3)
CO2: 23 mmol/L (ref 22–32)
Chloride: 103 mmol/L (ref 98–111)
Creatinine, Ser: 0.98 mg/dL (ref 0.44–1.00)
GFR calc Af Amer: >60 mL/min (ref >60)
GFR calc non Af Amer: >60 mL/min (ref >60)
Glucose, Bld: 97 mg/dL (ref 70–99)
Potassium: 4.1 mmol/L (ref 3.5–5.1)
Sodium: 135 mmol/L (ref 135–145)

## 2019-02-15 LAB — CBC
HCT: 44.2 % (ref 36.0–46.0)
Hemoglobin: 14.5 g/dL (ref 12.0–15.0)
MCH: 30.5 pg (ref 26.0–34.0)
MCHC: 32.8 g/dL (ref 30.0–36.0)
MCV: 92.9 fL (ref 80.0–100.0)
Platelets: 278 10*3/uL (ref 150–400)
RBC: 4.76 MIL/uL (ref 3.87–5.11)
RDW: 11.7 % (ref 11.5–15.5)
WBC: 6.7 10*3/uL (ref 4.0–10.5)
nRBC: 0 % (ref 0.0–0.2)

## 2019-02-15 LAB — SURGICAL PCR SCREEN
MRSA, PCR: NEGATIVE
Staphylococcus aureus: NEGATIVE

## 2019-02-15 NOTE — Progress Notes (Signed)
Pt recently dx bronchitis, pt completed antibiotic doxycycline on Sunday 02-12-2019.  Per pt no fever for a week only has a dry non-productive cough.  Spoke w/ anesthesia PA, Konrad Felix about pt.  Janett Billow assessed pt, told pt to bring albuterol inhaler with day of surgery but not to use it prior to coming dos.  Pt verbalized understanding to me about bringing inhaler day of surgery.

## 2019-02-16 NOTE — Anesthesia Preprocedure Evaluation (Addendum)
Anesthesia Evaluation  Patient identified by MRN, date of birth, ID band Patient awake    Reviewed: Allergy & Precautions, NPO status , Patient's Chart, lab work & pertinent test results  History of Anesthesia Complications Negative for: history of anesthetic complications  Airway Mallampati: II  TM Distance: >3 FB Neck ROM: Full    Dental  (+) Dental Advisory Given   Pulmonary neg pulmonary ROS,    Pulmonary exam normal        Cardiovascular hypertension, Normal cardiovascular exam     Neuro/Psych negative neurological ROS  negative psych ROS   GI/Hepatic Neg liver ROS, hiatal hernia, GERD  ,  Endo/Other  negative endocrine ROS  Renal/GU negative Renal ROS  negative genitourinary   Musculoskeletal negative musculoskeletal ROS (+)   Abdominal   Peds negative pediatric ROS (+)  Hematology negative hematology ROS (+)   Anesthesia Other Findings   Reproductive/Obstetrics negative OB ROS                           Anesthesia Physical Anesthesia Plan  ASA: II  Anesthesia Plan: Spinal   Post-op Pain Management:    Induction: Intravenous  PONV Risk Score and Plan: Ondansetron and Propofol infusion  Airway Management Planned: Simple Face Mask  Additional Equipment:   Intra-op Plan:   Post-operative Plan:   Informed Consent: I have reviewed the patients History and Physical, chart, labs and discussed the procedure including the risks, benefits and alternatives for the proposed anesthesia with the patient or authorized representative who has indicated his/her understanding and acceptance.     Dental advisory given  Plan Discussed with: CRNA and Anesthesiologist  Anesthesia Plan Comments: (See PAT note 02/15/19, Konrad Felix, PA-C)       Anesthesia Quick Evaluation

## 2019-02-16 NOTE — Progress Notes (Signed)
Anesthesia Chart Review   Case:  [id number] Date/Time:  02/17/19 1045   Procedure:  RIGHT TOTAL HIP ARTHROPLASTY ANTERIOR APPROACH (Right )   Anesthesia type:  Spinal   Pre-op diagnosis:  osteoarthritis right hip   Location:  [facility] ROOM 10 / [facility]   Surgeon:  Mcarthur Rossetti, MD      DISCUSSION: 62 yo never smoker with h/o HTN, GERD, hiatal hernia, right hip OA scheduled for above surgery 02/17/19 with Dr. Jean Rosenthal.   Pt seen by PCP 02/05/19 after experiencing 5-6 days of cough and congestion, dx with bronchitis and started on abx which she has completed.  Seen at PAT visit 02/15/19 by myself, reports mild intermittent dry cough has discontinued all medications including inhaler.  On exam appears well, lungs clear to auscultation.  Advised to bring inhaler.    EKG reviewed with Dr. Roanna Banning.  Pt can proceed with planned procedure barring acute status change.  VS: BP (!) 153/88   Pulse 78   Temp 36.7 C (Oral)   Resp 18   Ht _0  (1.6 m)   Wt 59.6 kg   SpO2 97%   BMI 23.29 kg/m   PROVIDERS: Chesley Noon, MD is PCP    LABS: Labs reviewed: Acceptable for surgery. (all labs ordered are listed, but only abnormal results are displayed)  Labs Reviewed  BASIC METABOLIC PANEL - Abnormal; Notable for the following components:      Result Value   BUN 28 (*)    All other components within normal limits  SURGICAL PCR SCREEN  CBC     IMAGES:   EKG: 02/15/2019 Rate 73 bpm Normal sinus rhythm  Cannot rule out Anterior infarct, age undetermined Abnormal ECG  CV:  Past Medical History:  Diagnosis Date  . Cough   . GERD (gastroesophageal reflux disease)   . Hiatal hernia   . History of kidney stones   . History of urethritis   . Hypertension   . OA (osteoarthritis)    knees and hips    Past Surgical History:  Procedure Laterality Date  . CESAREAN SECTION  1985  . COLONOSCOPY WITH PROPOFOL  01/2019  . [place];   07-25-2015  _1  by dr Janice Norrie  . KNEE ARTHROSCOPY Bilateral 2007  . LAPAROSCOPIC CHOLECYSTECTOMY  2007    MEDICATIONS: . acetaminophen (TYLENOL) 500 MG tablet  . ALPRAZolam (XANAX) 0.5 MG tablet  . atenolol (TENORMIN) 50 MG tablet  . celecoxib (CELEBREX) 200 MG capsule  . cephALEXin (KEFLEX) 250 MG capsule  . Cyanocobalamin (B-12) 1000 MCG/ML KIT  . Esomeprazole Magnesium (NEXIUM 24HR PO)  . Estradiol (VAGIFEM) 10 MCG TABS vaginal tablet  . hydrochlorothiazide (HYDRODIURIL) 25 MG tablet  . Vitamin D, Ergocalciferol, (DRISDOL) 50000 UNITS CAPS capsule   No current facility-administered medications for this encounter.      Maia Plan [facility] Pre-Surgical Testing [phone] 02/16/19 4:47 PM

## 2019-02-17 ENCOUNTER — Encounter (HOSPITAL_COMMUNITY): Payer: Self-pay | Admitting: Certified Registered Nurse Anesthetist

## 2019-02-17 ENCOUNTER — Inpatient Hospital Stay (HOSPITAL_COMMUNITY): Payer: BC Managed Care – PPO | Admitting: Physician Assistant

## 2019-02-17 ENCOUNTER — Inpatient Hospital Stay (HOSPITAL_COMMUNITY): Payer: BC Managed Care – PPO

## 2019-02-17 ENCOUNTER — Other Ambulatory Visit: Payer: Self-pay

## 2019-02-17 ENCOUNTER — Inpatient Hospital Stay (HOSPITAL_COMMUNITY): Payer: BC Managed Care – PPO | Admitting: Anesthesiology

## 2019-02-17 ENCOUNTER — Inpatient Hospital Stay (HOSPITAL_COMMUNITY)
Admission: RE | Admit: 2019-02-17 | Discharge: 2019-02-19 | DRG: 470 | Disposition: A | Discharge status: Home Health | Payer: BC Managed Care – PPO | Attending: Orthopaedic Surgery | Admitting: Orthopaedic Surgery

## 2019-02-17 ENCOUNTER — Encounter (HOSPITAL_COMMUNITY)
Admission: RE | Disposition: A | Discharge status: Home Health | Payer: Self-pay | Source: Home / Self Care | Attending: Orthopaedic Surgery

## 2019-02-17 DIAGNOSIS — Z888 Allergy status to other drugs, medicaments and biological substances status: Secondary | ICD-10-CM | POA: Diagnosis not present

## 2019-02-17 DIAGNOSIS — Z791 Long term (current) use of non-steroidal anti-inflammatories (NSAID): Secondary | ICD-10-CM | POA: Diagnosis not present

## 2019-02-17 DIAGNOSIS — Z87442 Personal history of urinary calculi: Secondary | ICD-10-CM

## 2019-02-17 DIAGNOSIS — K219 Gastro-esophageal reflux disease without esophagitis: Secondary | ICD-10-CM | POA: Diagnosis present

## 2019-02-17 DIAGNOSIS — Z79899 Other long term (current) drug therapy: Secondary | ICD-10-CM

## 2019-02-17 DIAGNOSIS — Z419 Encounter for procedure for purposes other than remedying health state, unspecified: Secondary | ICD-10-CM

## 2019-02-17 DIAGNOSIS — M25451 Effusion, right hip: Secondary | ICD-10-CM | POA: Diagnosis present

## 2019-02-17 DIAGNOSIS — M1611 Unilateral primary osteoarthritis, right hip: Secondary | ICD-10-CM | POA: Diagnosis present

## 2019-02-17 DIAGNOSIS — M25751 Osteophyte, right hip: Secondary | ICD-10-CM | POA: Diagnosis present

## 2019-02-17 DIAGNOSIS — Z881 Allergy status to other antibiotic agents status: Secondary | ICD-10-CM | POA: Diagnosis not present

## 2019-02-17 DIAGNOSIS — I10 Essential (primary) hypertension: Secondary | ICD-10-CM | POA: Diagnosis present

## 2019-02-17 DIAGNOSIS — M25551 Pain in right hip: Secondary | ICD-10-CM | POA: Diagnosis present

## 2019-02-17 DIAGNOSIS — Z9049 Acquired absence of other specified parts of digestive tract: Secondary | ICD-10-CM | POA: Diagnosis not present

## 2019-02-17 DIAGNOSIS — Z96641 Presence of right artificial hip joint: Secondary | ICD-10-CM

## 2019-02-17 HISTORY — PX: TOTAL HIP ARTHROPLASTY: SHX124

## 2019-02-17 SURGERY — ARTHROPLASTY, HIP, TOTAL, ANTERIOR APPROACH
Anesthesia: Spinal | Laterality: Right

## 2019-02-17 MED ORDER — EPHEDRINE SULFATE-NACL 50-0.9 MG/10ML-% IV SOSY
PREFILLED_SYRINGE | INTRAVENOUS | Status: DC | PRN
  Administered 2019-02-17 (×4): 5 mg via INTRAVENOUS

## 2019-02-17 MED ORDER — METHOCARBAMOL 500 MG IVPB - SIMPLE MED
INTRAVENOUS | Status: AC
  Filled 2019-02-17: qty 50

## 2019-02-17 MED ORDER — PROPOFOL 10 MG/ML IV BOLUS
INTRAVENOUS | Status: DC | PRN
  Administered 2019-02-17 (×2): 20 mg via INTRAVENOUS

## 2019-02-17 MED ORDER — CEFAZOLIN SODIUM-DEXTROSE 2-4 GM/100ML-% IV SOLN
2.0000 g | INTRAVENOUS | Status: AC
  Administered 2019-02-17: 2 g via INTRAVENOUS
  Filled 2019-02-17: qty 100

## 2019-02-17 MED ORDER — PANTOPRAZOLE SODIUM 40 MG PO TBEC: 40.0000 mg | DELAYED_RELEASE_TABLET | Freq: Every day | ORAL | Status: DC

## 2019-02-17 MED ORDER — MIDAZOLAM HCL 2 MG/2ML IJ SOLN
INTRAMUSCULAR | Status: AC
  Filled 2019-02-17: qty 2

## 2019-02-17 MED ORDER — DOCUSATE SODIUM 100 MG PO CAPS
100.0000 mg | ORAL_CAPSULE | Freq: Two times a day (BID) | ORAL | Status: DC
  Administered 2019-02-17 – 2019-02-19 (×4): 100 mg via ORAL
  Filled 2019-02-17 (×4): qty 1

## 2019-02-17 MED ORDER — ACETAMINOPHEN 500 MG PO TABS
1000.0000 mg | ORAL_TABLET | Freq: Once | ORAL | Status: AC
  Administered 2019-02-17: 1000 mg via ORAL
  Filled 2019-02-17: qty 2

## 2019-02-17 MED ORDER — ASPIRIN 81 MG PO CHEW
81.0000 mg | CHEWABLE_TABLET | Freq: Two times a day (BID) | ORAL | Status: DC
  Administered 2019-02-17 – 2019-02-19 (×4): 81 mg via ORAL
  Filled 2019-02-17 (×4): qty 1

## 2019-02-17 MED ORDER — ACETAMINOPHEN 325 MG PO TABS
325.0000 mg | ORAL_TABLET | Freq: Four times a day (QID) | ORAL | Status: DC | PRN
  Administered 2019-02-17 – 2019-02-19 (×2): 650 mg via ORAL
  Filled 2019-02-17 (×2): qty 2

## 2019-02-17 MED ORDER — MIDAZOLAM HCL 5 MG/5ML IJ SOLN
INTRAMUSCULAR | Status: DC | PRN
  Administered 2019-02-17: 2 mg via INTRAVENOUS

## 2019-02-17 MED ORDER — FENTANYL CITRATE (PF) 100 MCG/2ML IJ SOLN
25.0000 ug | INTRAMUSCULAR | Status: DC | PRN
  Administered 2019-02-17: 50 ug via INTRAVENOUS

## 2019-02-17 MED ORDER — LACTATED RINGERS IV SOLN
INTRAVENOUS | Status: DC
  Administered 2019-02-17 (×2): via INTRAVENOUS

## 2019-02-17 MED ORDER — 0.9 % SODIUM CHLORIDE (POUR BTL) OPTIME
TOPICAL | Status: DC | PRN
  Administered 2019-02-17: 1000 mL

## 2019-02-17 MED ORDER — PROPOFOL 10 MG/ML IV BOLUS
INTRAVENOUS | Status: AC
  Filled 2019-02-17: qty 60

## 2019-02-17 MED ORDER — EPHEDRINE 5 MG/ML INJ
INTRAVENOUS | Status: AC
  Filled 2019-02-17: qty 10

## 2019-02-17 MED ORDER — CELECOXIB 200 MG PO CAPS
200.0000 mg | ORAL_CAPSULE | Freq: Every day | ORAL | Status: DC
  Administered 2019-02-18 – 2019-02-19 (×2): 200 mg via ORAL
  Filled 2019-02-17 (×2): qty 1

## 2019-02-17 MED ORDER — BUPIVACAINE HCL (PF) 0.75 % IJ SOLN
INTRAMUSCULAR | Status: DC | PRN
  Administered 2019-02-17: 1.8 mL via INTRATHECAL

## 2019-02-17 MED ORDER — MENTHOL 3 MG MT LOZG: 1.0000 | LOZENGE | OROMUCOSAL | Status: DC | PRN

## 2019-02-17 MED ORDER — CYANOCOBALAMIN 1000 MCG/ML IJ SOLN: 1000.0000 ug | INTRAMUSCULAR | Status: DC

## 2019-02-17 MED ORDER — PROPOFOL 500 MG/50ML IV EMUL
INTRAVENOUS | Status: DC | PRN
  Administered 2019-02-17: 100 ug/kg/min via INTRAVENOUS

## 2019-02-17 MED ORDER — METOCLOPRAMIDE HCL 5 MG/ML IJ SOLN
5.0000 mg | Freq: Three times a day (TID) | INTRAMUSCULAR | Status: DC | PRN
  Administered 2019-02-17: 10 mg via INTRAVENOUS
  Filled 2019-02-17: qty 2

## 2019-02-17 MED ORDER — ONDANSETRON HCL 4 MG/2ML IJ SOLN
INTRAMUSCULAR | Status: DC | PRN
  Administered 2019-02-17: 4 mg via INTRAVENOUS

## 2019-02-17 MED ORDER — ONDANSETRON HCL 4 MG/2ML IJ SOLN
4.0000 mg | Freq: Four times a day (QID) | INTRAMUSCULAR | Status: DC | PRN
  Administered 2019-02-17: 4 mg via INTRAVENOUS
  Filled 2019-02-17: qty 2

## 2019-02-17 MED ORDER — METHOCARBAMOL 500 MG PO TABS
500.0000 mg | ORAL_TABLET | Freq: Four times a day (QID) | ORAL | Status: DC | PRN
  Administered 2019-02-17 – 2019-02-19 (×3): 500 mg via ORAL
  Filled 2019-02-17 (×3): qty 1

## 2019-02-17 MED ORDER — PHENOL 1.4 % MT LIQD
1.0000 | OROMUCOSAL | Status: DC | PRN
  Filled 2019-02-17: qty 177

## 2019-02-17 MED ORDER — ALUM & MAG HYDROXIDE-SIMETH 200-200-20 MG/5ML PO SUSP
30.0000 mL | ORAL | Status: DC | PRN
  Administered 2019-02-18: 30 mL via ORAL
  Filled 2019-02-17: qty 30

## 2019-02-17 MED ORDER — HYDROMORPHONE HCL 1 MG/ML IJ SOLN
0.5000 mg | INTRAMUSCULAR | Status: DC | PRN
  Administered 2019-02-17 – 2019-02-18 (×3): 1 mg via INTRAVENOUS
  Filled 2019-02-17 (×3): qty 1

## 2019-02-17 MED ORDER — HYDROCHLOROTHIAZIDE 25 MG PO TABS: 25.0000 mg | ORAL_TABLET | Freq: Every day | ORAL | Status: DC | PRN

## 2019-02-17 MED ORDER — CEFAZOLIN SODIUM-DEXTROSE 1-4 GM/50ML-% IV SOLN
1.0000 g | Freq: Four times a day (QID) | INTRAVENOUS | Status: AC
  Administered 2019-02-17 (×2): 1 g via INTRAVENOUS
  Filled 2019-02-17 (×2): qty 50

## 2019-02-17 MED ORDER — TRANEXAMIC ACID-NACL 1000-0.7 MG/100ML-% IV SOLN
1000.0000 mg | INTRAVENOUS | Status: AC
  Administered 2019-02-17: 1000 mg via INTRAVENOUS
  Filled 2019-02-17: qty 100

## 2019-02-17 MED ORDER — PHENYLEPHRINE HCL 10 MG/ML IJ SOLN
INTRAMUSCULAR | Status: AC
  Filled 2019-02-17: qty 1

## 2019-02-17 MED ORDER — FENTANYL CITRATE (PF) 100 MCG/2ML IJ SOLN
INTRAMUSCULAR | Status: AC
  Filled 2019-02-17: qty 2

## 2019-02-17 MED ORDER — ONDANSETRON HCL 4 MG PO TABS: 4.0000 mg | ORAL_TABLET | Freq: Four times a day (QID) | ORAL | Status: DC | PRN

## 2019-02-17 MED ORDER — SODIUM CHLORIDE 0.9 % IV SOLN
INTRAVENOUS | Status: DC
  Administered 2019-02-17: 16:00:00 via INTRAVENOUS

## 2019-02-17 MED ORDER — SODIUM CHLORIDE 0.9 % IV SOLN
INTRAVENOUS | Status: DC | PRN
  Administered 2019-02-17: 30 ug/min via INTRAVENOUS

## 2019-02-17 MED ORDER — METHOCARBAMOL 500 MG IVPB - SIMPLE MED
500.0000 mg | Freq: Four times a day (QID) | INTRAVENOUS | Status: DC | PRN
  Administered 2019-02-17: 500 mg via INTRAVENOUS
  Filled 2019-02-17: qty 50

## 2019-02-17 MED ORDER — POLYETHYLENE GLYCOL 3350 17 G PO PACK: 17.0000 g | PACK | Freq: Every day | ORAL | Status: DC | PRN

## 2019-02-17 MED ORDER — OXYCODONE HCL 5 MG PO TABS
10.0000 mg | ORAL_TABLET | ORAL | Status: DC | PRN
  Administered 2019-02-18 (×2): 10 mg via ORAL
  Administered 2019-02-19: 15 mg via ORAL
  Administered 2019-02-19: 10 mg via ORAL
  Filled 2019-02-17: qty 3

## 2019-02-17 MED ORDER — ALPRAZOLAM 0.25 MG PO TABS: 0.2500 mg | ORAL_TABLET | Freq: Every evening | ORAL | Status: DC | PRN

## 2019-02-17 MED ORDER — SODIUM CHLORIDE 0.9 % IR SOLN
Status: DC | PRN
  Administered 2019-02-17: 1000 mL

## 2019-02-17 MED ORDER — FENTANYL CITRATE (PF) 100 MCG/2ML IJ SOLN
INTRAMUSCULAR | Status: DC | PRN
  Administered 2019-02-17: 100 ug via INTRAVENOUS

## 2019-02-17 MED ORDER — OXYCODONE HCL 5 MG PO TABS
5.0000 mg | ORAL_TABLET | ORAL | Status: DC | PRN
  Administered 2019-02-17: 5 mg via ORAL
  Administered 2019-02-17: 10 mg via ORAL
  Administered 2019-02-17: 5 mg via ORAL
  Administered 2019-02-18: 10 mg via ORAL
  Filled 2019-02-17: qty 2
  Filled 2019-02-17: qty 1
  Filled 2019-02-17 (×2): qty 2
  Filled 2019-02-17: qty 1
  Filled 2019-02-17 (×2): qty 2

## 2019-02-17 MED ORDER — PROMETHAZINE HCL 25 MG/ML IJ SOLN: 6.2500 mg | INTRAMUSCULAR | Status: DC | PRN

## 2019-02-17 MED ORDER — METOCLOPRAMIDE HCL 5 MG PO TABS: 5.0000 mg | ORAL_TABLET | Freq: Three times a day (TID) | ORAL | Status: DC | PRN

## 2019-02-17 MED ORDER — DIPHENHYDRAMINE HCL 12.5 MG/5ML PO ELIX: 12.5000 mg | ORAL_SOLUTION | ORAL | Status: DC | PRN

## 2019-02-17 MED ORDER — PROPOFOL 10 MG/ML IV BOLUS
INTRAVENOUS | Status: AC
  Filled 2019-02-17: qty 20

## 2019-02-17 MED ORDER — ATENOLOL 50 MG PO TABS: 50.0000 mg | ORAL_TABLET | Freq: Every day | ORAL | Status: DC

## 2019-02-17 MED ORDER — CHLORHEXIDINE GLUCONATE 4 % EX LIQD: 60.0000 mL | Freq: Once | CUTANEOUS | Status: DC

## 2019-02-17 SURGICAL SUPPLY — 44 items
APL SKNCLS STERI-STRIP NONHPOA (GAUZE/BANDAGES/DRESSINGS) ×1
ARTICULEZE HEAD (Hips) ×3 IMPLANT
BAG SPEC THK2 15X12 ZIP CLS (MISCELLANEOUS)
BAG ZIPLOCK 12X15 (MISCELLANEOUS) IMPLANT
BALL HIP ARTICU EZE 36 8.5 (Hips) IMPLANT
BENZOIN TINCTURE PRP APPL 2/3 (GAUZE/BANDAGES/DRESSINGS) ×2 IMPLANT
BLADE SAW SGTL 18X1.27X75 (BLADE) ×2 IMPLANT
BLADE SAW SGTL 18X1.27X75MM (BLADE) ×1
BLADE SURG SZ10 CARB STEEL (BLADE) ×6 IMPLANT
CLOSURE WOUND 1/2 X4 (GAUZE/BANDAGES/DRESSINGS) ×1
COVER PERINEAL POST (MISCELLANEOUS) ×3 IMPLANT
COVER SURGICAL LIGHT HANDLE (MISCELLANEOUS) ×3 IMPLANT
COVER WAND RF STERILE (DRAPES) IMPLANT
DRAPE STERI IOBAN 125X83 (DRAPES) ×3 IMPLANT
DRAPE U-SHAPE 47X51 STRL (DRAPES) ×6 IMPLANT
DRSG AQUACEL AG ADV 3.5X10 (GAUZE/BANDAGES/DRESSINGS) ×3 IMPLANT
DURAPREP 26ML APPLICATOR (WOUND CARE) ×3 IMPLANT
ELECT REM PT RETURN 15FT ADLT (MISCELLANEOUS) ×3 IMPLANT
GAUZE XEROFORM 1X8 LF (GAUZE/BANDAGES/DRESSINGS) IMPLANT
GLOVE BIO SURGEON STRL SZ7.5 (GLOVE) ×3 IMPLANT
GLOVE BIOGEL PI IND STRL 8 (GLOVE) ×2 IMPLANT
GLOVE BIOGEL PI INDICATOR 8 (GLOVE) ×4
GLOVE ECLIPSE 8.0 STRL XLNG CF (GLOVE) ×3 IMPLANT
GOWN STRL REUS W/TWL XL LVL3 (GOWN DISPOSABLE) ×6 IMPLANT
HANDPIECE INTERPULSE COAX TIP (DISPOSABLE) ×3
HEAD ARTICULEZE (Hips) IMPLANT
HIP BALL ARTICU EZE 36 8.5 (Hips) ×3 IMPLANT
HOLDER FOLEY CATH W/STRAP (MISCELLANEOUS) ×3 IMPLANT
LINER NEUTRAL 52X36MM PLUS 4 (Liner) ×2 IMPLANT
PACK ANTERIOR HIP CUSTOM (KITS) ×3 IMPLANT
PIN SECTOR W/GRIP ACE CUP 52MM (Hips) ×2 IMPLANT
SCREW 6.5MMX25MM (Screw) ×2 IMPLANT
SET HNDPC FAN SPRY TIP SCT (DISPOSABLE) ×1 IMPLANT
STAPLER VISISTAT 35W (STAPLE) IMPLANT
STEM FEM ACTIS STD SZ4 (Stem) ×2 IMPLANT
STRIP CLOSURE SKIN 1/2X4 (GAUZE/BANDAGES/DRESSINGS) ×1 IMPLANT
SUT ETHIBOND NAB CT1 #1 30IN (SUTURE) ×3 IMPLANT
SUT MNCRL AB 4-0 PS2 18 (SUTURE) IMPLANT
SUT VIC AB 0 CT1 36 (SUTURE) ×3 IMPLANT
SUT VIC AB 1 CT1 36 (SUTURE) ×3 IMPLANT
SUT VIC AB 2-0 CT1 27 (SUTURE) ×6
SUT VIC AB 2-0 CT1 TAPERPNT 27 (SUTURE) ×2 IMPLANT
TRAY FOLEY MTR SLVR 16FR STAT (SET/KITS/TRAYS/PACK) ×3 IMPLANT
YANKAUER SUCT BULB TIP 10FT TU (MISCELLANEOUS) ×3 IMPLANT

## 2019-02-17 NOTE — Anesthesia Procedure Notes (Addendum)
Spinal  Patient location during procedure: OR Start time: 02/17/2019 11:51 AM End time: 02/17/2019 11:55 AM Staffing Resident/CRNA: Claudia Desanctis, CRNA Performed: resident/CRNA  Preanesthetic Checklist Completed: patient identified, site marked, surgical consent, pre-op evaluation, timeout performed, IV checked, risks and benefits discussed and monitors and equipment checked Spinal Block Patient position: sitting Prep: DuraPrep Patient monitoring: heart rate, cardiac monitor, continuous pulse ox and blood pressure Approach: midline Location: L3-4 Injection technique: single-shot Needle Needle type: Sprotte and Pencan  Needle gauge: 24 G Needle length: 10 cm Needle insertion depth: 7 cm Assessment Sensory level: T4

## 2019-02-17 NOTE — Op Note (Signed)
NAME: KENDAHL, BUMGARDNER MEDICAL RECORD JO:[id number] ACCOUNT [id number] DATE OF BIRTH:1956-05-04 FACILITY[facility] LOCATION: [facility] PHYSICIAN:Aaliyha Mumford Kerry [place], MD  OPERATIVE REPORT  DATE OF PROCEDURE:  02/17/2019  PREOPERATIVE DIAGNOSIS:  Primary osteoarthritis and degenerative joint disease, right hip.  POSTOPERATIVE DIAGNOSIS:  Primary osteoarthritis and degenerative joint disease, right hip.  PROCEDURE:  Right total hip arthroplasty through direct anterior approach.  IMPLANTS:  DePuy [employer] acetabular component size 52 with a single screw, size 36+4 neutral polyethylene liner, size 4 Actis femoral component with standard offset, size 36+8.5 metal hip ball.  SURGEON:  Lind Guest. Ninfa Linden, MD  ASSISTANT:  Erskine Emery, PA-C.  ANESTHESIA:  Spinal.  ANTIBIOTICS:  Two grams IV Ancef.  ESTIMATED BLOOD LOSS:  300 mL.  COMPLICATIONS:  None.  INDICATIONS:  The patient is a very pleasant 63 year old female with debilitating arthritis involving her right hip.  Her x-rays show complete loss of the joint space.  Her left hip is normal.  At this point, her right hip osteoarthritis has  detrimentally affected her mobility her quality of life and her activities of daily living.  Her pain is daily.  At this point, she does wish to proceed with total hip arthroplasty.  We talked about the risk of acute blood loss anemia, nerve or vessel  injury, fracture, infection, dislocation, implant failure.  She understands our goals are to decrease pain, improve mobility and overall improve quality of life.  DESCRIPTION OF PROCEDURE:  After informed consent was obtained and appropriate right hip was marked.  She was brought to the operating room and sat up on a stretcher where spinal anesthesia was obtained.  She was then laid in supine position on a  stretcher.  Foley catheter was placed.  I was able to get a true adjustment of her leg lengths.  Her preoperative x-rays standing show that she  is short on the right side and certainly with her lying in supine position I could tell that she is definitely  shorter on the right than the left side.  I then placed traction boots on both her feet and we placed her supine on the Hana fracture table with the perineal post in place and both legs in line skeletal traction device and no traction applied.  I did  get a preoperative picture just again to verify that she is shorter on her right side.  We then prepped and draped the right hip with ChloraPrep and sterile drapes.  A time-out was called and she was identified as the correct patient, correct right hip.   I then made an incision just inferior and posterior to the anterior superior iliac spine and carried this obliquely down the leg.  We dissected down tensor fascia lata muscle.  Tensor fascia was then divided longitudinally to proceed with direct  anterior approach to the hip.  We identified and cauterized circumflex vessels and identified the hip capsule.  Under the hip capsule in an L-type format, finding moderate joint effusion and significant periarticular osteophytes around the femoral head  and neck.  I then made our femoral neck cut with an oscillating saw and completed this with an osteotome.  I placed a corkscrew guide in the femoral head and removed the femoral head and found it to be devoid of cartilage over a wide area.  I did notice  that her bone was significantly soft.  I then placed a bent Hohmann over the medial acetabular rim and removed remnants of the acetabular labrum and other debris.  I then began reaming from a size 44 reamer in stepwise increments up to a size 51 with all  reamers under direct visualization, the last reamer under direct fluoroscopy, so I could obtain my depth in reaming our inclination and anteversion.  Within reaming though with her soft bone actually medialized her more than I normally would.  With that  being said, I then took the reamings as a bone graft  material and placed it deep within the hip socket.  I then placed the real size 52 acetabular component and I did place a single screw.  This was again done under direct visualization and fluoroscopy,  so I could obtain our good position of the cup.  I was pleased with the alignment then.  Attention was then turned to the femur.  With the leg externally rotated to 120 degrees, extended and adducted, we were able to place a Mueller retractor medially  and Hohman retractor behind the greater trochanter, released lateral joint capsule and used a box-cutting osteotome and enter the femoral canal and a rongeur to lateralize then began broaching with a zero broach, going up to a size 4 with the Actis  broaching system from DePuy.  With a size 4 implant broach in place, I then placed a standard neck and a 36+1.5 hip ball, reduced this in the acetabulum and we right away knew that we needed much more leg length and offset.  We dislocated the hip and  removed the trial components.  I placed the real Actis femoral component size 4 with standard offset.  We went with a +5 hip ball, reduced this in the acetabulum and actually ended up needing even more length so I had actually sacrifice and remove that  +5 hip ball and went to a 8.5 metal hip ball and that gave [place] the stability and leg length and offset that we wanted.  We then irrigated the soft tissue with normal saline solution using pulsatile lavage.  We closed the joint capsule with interrupted #1  Ethibond suture, followed by running #1 Vicryl and tensor fascia, 0 Vicryl was placed and the deep tissue, 2-0 Vicryl was placed in subcutaneous tissue and subcuticular tissue was closed with 4-0 Monocryl suture.  Steri-Strips were applied to the skin.   An Aquacel dressing was placed.  She was taken off the Hana table and taken to the recovery room in stable condition.  All final counts were correct.  There were no complications noted.  Of note, Benita Stabile, PA-C, assisted  in the entire case.  Hi  assistance was crucial for facilitating all aspects of the case.  TN/NUANCE  D:02/17/2019 T:02/17/2019 JOB:[id number]

## 2019-02-17 NOTE — Anesthesia Procedure Notes (Addendum)
Procedure Name: MAC Date/Time: 02/17/2019 11:51 AM Performed by: Claudia Desanctis, CRNA Pre-anesthesia Checklist: Patient identified, Emergency Drugs available, Suction available and Patient being monitored Oxygen Delivery Method: Simple face mask

## 2019-02-17 NOTE — Transfer of Care (Signed)
Immediate Anesthesia Transfer of Care Note  Patient: Morgan Anthony  Procedure(s) Performed: Procedure(s): RIGHT TOTAL HIP ARTHROPLASTY ANTERIOR APPROACH (Right)  Patient Location: PACU  Anesthesia Type:Spinal  Level of Consciousness:  sedated, patient cooperative and responds to stimulation  Airway & Oxygen Therapy:Patient Spontanous Breathing and Patient connected to face mask oxgen  Post-op Assessment:  Report given to PACU RN and Post -op Vital signs reviewed and stable  Post vital signs:  Reviewed and stable  Last Vitals:  Vitals:   02/17/19 0839 02/17/19 1352  BP: (!) 148/95 97/67  Pulse: 72 78  Resp: 16 17  Temp: (!) 36.3 C 36.6 C  SpO2: 85% 927%    Complications: No apparent anesthesia complications

## 2019-02-17 NOTE — Evaluation (Signed)
Physical Therapy Evaluation Patient Details Name: Morgan Anthony MRN: [id number] DOB: 08-Dec-1956 Today's Date: 02/17/2019   History of Present Illness  s/p R DA THA  Clinical Impression  Pt is s/p THA resulting in the deficits listed below (see PT Problem List).  Pt amb ~ 40', limited by nausea; continue PT in acute setting  Pt will benefit from skilled PT to increase their independence and safety with mobility to allow discharge to the venue listed below.      Follow Up Recommendations Home health PT    Equipment Recommendations  Other (comment)(has rollator, ?RW )    Recommendations for Other Services       Precautions / Restrictions Precautions Precautions: Fall Restrictions Weight Bearing Restrictions: No Other Position/Activity Restrictions: WBAT      Mobility  Bed Mobility Overal bed mobility: Needs Assistance Bed Mobility: Supine to Sit     Supine to sit: Min assist     General bed mobility comments: assist with LEs  Transfers Overall transfer level: Needs assistance Equipment used: Rolling walker (2 wheeled) Transfers: Sit to/from Stand Sit to Stand: Min assist         General transfer comment: cues for hand placement  Ambulation/Gait Ambulation/Gait assistance: Min assist Gait Distance (Feet): 40 Feet Assistive device: Rolling walker (2 wheeled) Gait Pattern/deviations: Step-to pattern;Decreased weight shift to right     General Gait Details: cues for sequence and RW position  Stairs            Wheelchair Mobility    Modified Rankin (Stroke Patients Only)       Balance                                             Pertinent Vitals/Pain Pain Assessment: 0-10 Pain Score: 4  Pain Location: right hip Pain Descriptors / Indicators: Sore Pain Intervention(s): Monitored during session;Limited activity within patient's tolerance;Repositioned;Ice applied    Home Living Family/patient expects to be discharged to::  Private residence Living Arrangements: Spouse/significant other   Type of Home: House Home Access: Stairs to enter   [employer] of Steps: 2 Home Layout: One level Home Equipment: Environmental consultant - 4 wheels;Toilet riser      Prior Function Level of Independence: Independent               Hand Dominance        Extremity/Trunk Assessment   Upper Extremity Assessment Upper Extremity Assessment: Defer to OT evaluation    Lower Extremity Assessment Lower Extremity Assessment: RLE deficits/detail RLE Deficits / Details: ankle WFL; slight tingling right foot RLE: Unable to fully assess due to pain       Communication   Communication: No difficulties  Cognition Arousal/Alertness: Awake/alert Behavior During Therapy: WFL for tasks assessed/performed Overall Cognitive Status: Within Functional Limits for tasks assessed                                        General Comments      Exercises Total Joint Exercises Ankle Circles/Pumps: AROM;Both;10 reps   Assessment/Plan    PT Assessment Patient needs continued PT services  PT Problem List Decreased strength;Decreased range of motion;Decreased activity tolerance;Decreased mobility;Decreased knowledge of use of DME       PT Treatment Interventions DME instruction;Gait training;Functional mobility  training;Therapeutic activities;Therapeutic exercise;Patient/family education;Stair training    PT Goals (Current goals can be found in the Care Plan section)  Acute Rehab PT Goals Patient Stated Goal: less pain PT Goal Formulation: With patient Time For Goal Achievement: 02/24/19 Potential to Achieve Goals: Good    Frequency 7X/week   Barriers to discharge        Co-evaluation               AM-PAC PT "6 Clicks" Mobility  Outcome Measure Help needed turning from your back to your side while in a flat bed without using bedrails?: A Little Help needed moving from lying on your back to  sitting on the side of a flat bed without using bedrails?: A Little Help needed moving to and from a bed to a chair (including a wheelchair)?: A Little Help needed standing up from a chair using your arms (e.g., wheelchair or bedside chair)?: A Little Help needed to walk in hospital room?: A Little Help needed climbing 3-5 steps with a railing? : A Little 6 Click Score: 18    End of Session Equipment Utilized During Treatment: Gait belt Activity Tolerance: Patient tolerated treatment well Patient left: in chair;with call bell/phone within reach;with family/visitor present;with chair alarm set   PT Visit Diagnosis: Difficulty in walking, not elsewhere classified (R26.2)    Time: 1324-4010 PT Time Calculation (min) (ACUTE ONLY): 29 min   Charges:   PT Evaluation $PT Eval Low Complexity: 1 Low PT Treatments $Gait Training: 8-22 mins        Kenyon Ana, PT  Pager: [phone] Acute Rehab Dept [facility]): [phone]   02/17/2019   [facility] 02/17/2019, 5:02 PM

## 2019-02-17 NOTE — Brief Op Note (Signed)
02/17/2019  1:18 PM  PATIENT:  Morgan Anthony  63 y.o. female  PRE-OPERATIVE DIAGNOSIS:  osteoarthritis right hip  POST-OPERATIVE DIAGNOSIS:  osteoarthritis right hip  PROCEDURE:  Procedure(s): RIGHT TOTAL HIP ARTHROPLASTY ANTERIOR APPROACH (Right)  SURGEON:  Surgeon(s) and Role:    Mcarthur Rossetti, MD - Primary  PHYSICIAN ASSISTANT: Benita Stabile, PA-C  ANESTHESIA:   spinal  EBL:  300 mL   COUNTS:  YES  TOURNIQUET:  * No tourniquets in log *  DICTATION: .Other Dictation: Dictation Number [id number]  PLAN OF CARE: Admit to inpatient   PATIENT DISPOSITION:  PACU - hemodynamically stable.   Delay start of Pharmacological VTE agent (>24hrs) due to surgical blood loss or risk of bleeding: no

## 2019-02-17 NOTE — Anesthesia Postprocedure Evaluation (Signed)
Anesthesia Post Note  Patient: Morgan Anthony  Procedure(s) Performed: RIGHT TOTAL HIP ARTHROPLASTY ANTERIOR APPROACH (Right )     Patient location during evaluation: PACU Anesthesia Type: Spinal Level of consciousness: awake and alert Pain management: pain level controlled Vital Signs Assessment: post-procedure vital signs reviewed and stable Respiratory status: spontaneous breathing and respiratory function stable Cardiovascular status: blood pressure returned to baseline and stable Postop Assessment: spinal receding Anesthetic complications: no    Last Vitals:  Vitals:   02/17/19 1456 02/17/19 1515  BP: 99/71 113/73  Pulse:  63  Resp:  15  Temp:  36.6 C  SpO2:  100%    Last Pain:  Vitals:   02/17/19 1515  TempSrc: Oral  PainSc: 5                  Shaka Zech DANIEL

## 2019-02-17 NOTE — Care Management Note (Signed)
Case Management Note  Patient Details  Name: Morgan Anthony MRN: [id number] Date of Birth: [birth year]  Subjective/Objective:                  Discharge planning  Action/Plan: See below  Expected Discharge Date:                  Expected Discharge Plan:  [place]  In-House Referral:     Discharge planning Services  CM Consult  Post Acute Care Choice:  Home Health Choice offered to:     DME Arranged:    DME Agency:     HH Arranged:  PT [place]:  [facility]  Status of Service:  In process, will continue to follow  If discussed at Long Length of Stay Meetings, dates discussed:    Additional Comments:  Leeroy Cha, RN 02/17/2019, 10:54 AM

## 2019-02-17 NOTE — H&P (Signed)
TOTAL HIP ADMISSION H&P  Patient is admitted for right total hip arthroplasty.  Subjective:  Chief Complaint: right hip pain  HPI: Morgan Anthony, 63 y.o. female, has a history of pain and functional disability in the right hip(s) due to arthritis and patient has failed non-surgical conservative treatments for greater than 12 weeks to include NSAID's and/or analgesics, flexibility and strengthening excercises and activity modification.  Onset of symptoms was gradual starting 3 years ago with gradually worsening course since that time.The patient noted no past surgery on the right hip(s).  Patient currently rates pain in the right hip at 10 out of 10 with activity. Patient has night pain, worsening of pain with activity and weight bearing, trendelenberg gait, pain that interfers with activities of daily living, pain with passive range of motion, crepitus and joint swelling. Patient has evidence of subchondral cysts, subchondral sclerosis, periarticular osteophytes and joint space narrowing by imaging studies. This condition presents safety issues increasing the risk of falls.  There is no current active infection.  Patient Active Problem List   Diagnosis Date Noted  . Unilateral primary osteoarthritis, right hip 01/18/2019   Past Medical History:  Diagnosis Date  . Cough   . GERD (gastroesophageal reflux disease)   . Hiatal hernia   . History of kidney stones   . History of urethritis   . Hypertension   . OA (osteoarthritis)    knees and hips    Past Surgical History:  Procedure Laterality Date  . CESAREAN SECTION  1985  . COLONOSCOPY WITH PROPOFOL  01/2019  . [place];  07-25-2015  _0  by dr Janice Norrie  . KNEE ARTHROSCOPY Bilateral 2007  . LAPAROSCOPIC CHOLECYSTECTOMY  2007    No current facility-administered medications for this encounter.    Current Outpatient Medications  Medication Sig Dispense Refill Last Dose  . acetaminophen (TYLENOL) 500 MG  tablet Take 1,000 mg by mouth every 6 (six) hours as needed for moderate pain.    Past Week  . ALPRAZolam (XANAX) 0.5 MG tablet Take 0.25 mg by mouth at bedtime as needed for sleep.    01/02/2019  . atenolol (TENORMIN) 50 MG tablet Take 50 mg by mouth daily.   1 01/02/2019  . celecoxib (CELEBREX) 200 MG capsule Take 200 mg by mouth daily.   2 01/01/2019  . cephALEXin (KEFLEX) 250 MG capsule Take 250 mg by mouth 2 (two) times a week. As needed for dysuria  1 Past Week  . Cyanocobalamin (B-12) 1000 MCG/ML KIT Inject 1,000 mcg as directed every 30 (thirty) days.     . Esomeprazole Magnesium (NEXIUM 24HR PO) Take 40 mg by mouth daily.   4 01/01/2019  . Estradiol (VAGIFEM) 10 MCG TABS vaginal tablet Place 10 mcg vaginally 2 (two) times a week.   01/01/2019  . hydrochlorothiazide (HYDRODIURIL) 25 MG tablet Take 25 mg by mouth daily as needed (swelling).    Past Week  . Vitamin D, Ergocalciferol, (DRISDOL) 50000 UNITS CAPS capsule Take 1 capsule by mouth every 28 (twenty-eight) days.  3 Past Week   Allergies  Allergen Reactions  . Cozaar [Losartan Potassium] Shortness Of Breath  . Trimethoprim Swelling    Social History   Tobacco Use  . Smoking status: Never Smoker  . Smokeless tobacco: Never Used  Substance Use Topics  . Alcohol use: No    Family History  Problem Relation Age of Onset  . Colon cancer Neg Hx   . Esophageal cancer Neg Hx   .  Rectal cancer Neg Hx   . Stomach cancer Neg Hx      Review of Systems  Musculoskeletal: Positive for joint pain.  All other systems reviewed and are negative.   Objective:  Physical Exam  Constitutional: She is oriented to person, place, and time. She appears well-developed and well-nourished.  HENT:  Head: Normocephalic and atraumatic.  Eyes: Pupils are equal, round, and reactive to light. EOM are normal.  Neck: Normal range of motion. Neck supple.  Cardiovascular: Normal rate.  Respiratory: Effort normal.  GI: Soft.  Musculoskeletal:      Right hip: She exhibits decreased range of motion, decreased strength, tenderness and bony tenderness.  Neurological: She is alert and oriented to person, place, and time.    Vital signs in last 24 hours:    Labs:   Estimated body mass index is 23.29 kg/m as calculated from the following:   Height as of 02/15/19: _0  (1.6 m).   Weight as of 02/15/19: 59.6 kg.   Imaging Review Plain radiographs demonstrate severe degenerative joint disease of the right hip(s). The bone quality appears to be excellent for age and reported activity level.      Assessment/Plan:  End stage arthritis, right hip(s)  The patient history, physical examination, clinical judgement of the provider and imaging studies are consistent with end stage degenerative joint disease of the right hip(s) and total hip arthroplasty is deemed medically necessary. The treatment options including medical management, injection therapy, arthroscopy and arthroplasty were discussed at length. The risks and benefits of total hip arthroplasty were presented and reviewed. The risks due to aseptic loosening, infection, stiffness, dislocation/subluxation,  thromboembolic complications and other imponderables were discussed.  The patient acknowledged the explanation, agreed to proceed with the plan and consent was signed. Patient is being admitted for inpatient treatment for surgery, pain control, PT, OT, prophylactic antibiotics, VTE prophylaxis, progressive ambulation and ADL's and discharge planning.The patient is planning to be discharged home with home health services

## 2019-02-18 LAB — BASIC METABOLIC PANEL
Anion gap: 5 (ref 5–15)
BUN: 25 mg/dL — ABNORMAL HIGH (ref 8–23)
CALCIUM: 8.2 mg/dL — AB (ref 8.9–10.3)
CO2: 25 mmol/L (ref 22–32)
Chloride: 107 mmol/L (ref 98–111)
Creatinine, Ser: 0.89 mg/dL (ref 0.44–1.00)
GFR calc Af Amer: >60 mL/min (ref >60)
GFR calc non Af Amer: >60 mL/min (ref >60)
Glucose, Bld: 117 mg/dL — ABNORMAL HIGH (ref 70–99)
Potassium: 3.7 mmol/L (ref 3.5–5.1)
Sodium: 137 mmol/L (ref 135–145)

## 2019-02-18 LAB — CBC
HCT: 32.5 % — ABNORMAL LOW (ref 36.0–46.0)
Hemoglobin: 10 g/dL — ABNORMAL LOW (ref 12.0–15.0)
MCH: 29.9 pg (ref 26.0–34.0)
MCHC: 30.8 g/dL (ref 30.0–36.0)
MCV: 97.3 fL (ref 80.0–100.0)
Platelets: 177 10*3/uL (ref 150–400)
RBC: 3.34 MIL/uL — ABNORMAL LOW (ref 3.87–5.11)
RDW: 12 % (ref 11.5–15.5)
WBC: 6.8 10*3/uL (ref 4.0–10.5)
nRBC: 0 % (ref 0.0–0.2)

## 2019-02-18 MED ORDER — PANTOPRAZOLE SODIUM 40 MG PO TBEC
40.0000 mg | DELAYED_RELEASE_TABLET | Freq: Every day | ORAL | Status: DC
  Administered 2019-02-18 – 2019-02-19 (×2): 40 mg via ORAL
  Filled 2019-02-18 (×2): qty 1

## 2019-02-18 MED ORDER — ATENOLOL 50 MG PO TABS
50.0000 mg | ORAL_TABLET | Freq: Every day | ORAL | Status: DC
  Administered 2019-02-18 – 2019-02-19 (×2): 50 mg via ORAL
  Filled 2019-02-18 (×2): qty 1

## 2019-02-18 NOTE — Discharge Instructions (Signed)

## 2019-02-18 NOTE — Progress Notes (Signed)
Pt stable at time of bedside report with Ardae RN. No needs at time of report. Pt had just been medicated for pain.

## 2019-02-18 NOTE — Evaluation (Signed)
Occupational Therapy Evaluation Patient Details Name: Morgan Anthony MRN: [id number] DOB: [birth year] Today's Date: 02/18/2019    History of Present Illness s/p R DA THA   Clinical Impression   OT education complete. Husband very supportive and will A as needed    Follow Up Recommendations  No OT follow up;Supervision - Intermittent    Equipment Recommendations  3 in 1 bedside commode    Recommendations for Other Services       Precautions / Restrictions Precautions Precautions: Fall Restrictions Weight Bearing Restrictions: No Other Position/Activity Restrictions: WBAT      Mobility Bed Mobility Overal bed mobility: Needs Assistance Bed Mobility: Supine to Sit     Supine to sit: Min assist     General bed mobility comments: assist with LEs  Transfers Overall transfer level: Needs assistance Equipment used: Rolling walker (2 wheeled) Transfers: Sit to/from [employer] Sit to Stand: Min guard Stand pivot transfers: Min guard       General transfer comment: cues for hand placement        ADL either performed or assessed with clinical judgement   ADL Overall ADL's : Needs assistance/impaired                                 Tub/ Shower Transfer: Minimal assistance     General ADL Comments: Pt doing well !  Pt overall S- min A with ADL Activity and husband will A as needed.  Education provided regarding strategies for ADL activity s/p THA.  3 n 1 delivered topts room during OT eval.       Vision Patient Visual Report: No change from baseline              Pertinent Vitals/Pain Pain Assessment: 0-10 Pain Score: 2  Pain Location: right hip Pain Descriptors / Indicators: Sore Pain Intervention(s): Monitored during session;Repositioned;Limited activity within patient's tolerance     Hand Dominance     Extremity/Trunk Assessment Upper Extremity Assessment Upper Extremity Assessment: Overall WFL for tasks assessed            Communication Communication Communication: No difficulties   Cognition Arousal/Alertness: Awake/alert Behavior During Therapy: WFL for tasks assessed/performed Overall Cognitive Status: Within Functional Limits for tasks assessed                                           Shoulder Instructions      Home Living Family/patient expects to be discharged to:: Private residence   Available Help at Discharge: Family Type of Home: House Home Access: Stairs to enter Technical brewer of Steps: 2   Home Layout: One level     Bathroom Shower/Tub: Teacher, early years/pre: Standard     Home Equipment: Environmental consultant - 4 wheels;Toilet riser          Prior Functioning/Environment Level of Independence: Independent                          OT Goals(Current goals can be found in the care plan section) Acute Rehab OT Goals Patient Stated Goal: care for grandkids  OT Frequency:      AM-PAC OT "6 Clicks" Daily Activity     Outcome Measure Help from another person eating meals?: None Help from another person taking care  of personal grooming?: None Help from another person toileting, which includes using toliet, bedpan, or urinal?: A Little Help from another person bathing (including washing, rinsing, drying)?: A Little Help from another person to put on and taking off regular upper body clothing?: None Help from another person to put on and taking off regular lower body clothing?: A Little 6 Click Score: 21   End of Session Equipment Utilized During Treatment: Rolling walker Nurse Communication: Mobility status  Activity Tolerance: Patient tolerated treatment well Patient left: in chair                   Time: 4742-5956 OT Time Calculation (min): 22 min Charges:  OT General Charges $OT Visit: 1 Visit OT Evaluation $OT Eval Low Complexity: 1 Low  Morgan Anthony, OT Acute Rehabilitation Services [phone] Office- [phone]     Morgan Anthony, Morgan Anthony 02/18/2019, 6:22 PM

## 2019-02-18 NOTE — Care Management Note (Addendum)
Case Management Note  Patient Details  Name: Morgan Anthony MRN: [id number] Date of Birth: 1956-09-26  Subjective/Objective:   Pt s/p R THA                 Action/Plan: Spoke to pt and offered choice for HH/Medicare list given and placed on chart. Pt has Rollator and toilet riser at home. Needs RW. Espino for RW to be delivered to room prior to dc.  Pt agreeable to [facility].  Expected Discharge Date:                  Expected Discharge Plan:  [place]  In-House Referral:  NA  Discharge planning Services  CM Consult  Post Acute Care Choice:  Home Health Choice offered to:     DME Arranged:  Walker rolling DME Agency:  [facility]  HH Arranged:  PT [place] Agency:  [facility] (formerly [employer])  Status of Service:  Completed, signed off  If discussed at [employer], dates discussed:    Additional Comments:  Erenest Rasher, RN 02/18/2019, 2:41 PM

## 2019-02-18 NOTE — Progress Notes (Addendum)
Physical Therapy Treatment Patient Details Name: Morgan Anthony MRN: [id number] DOB: 1956-02-04 Today's Date: 02/18/2019    History of Present Illness s/p R DA THA    PT Comments    Pt  progressing well; slightly fatigued after OT session but agreeable to therapeutic exercise; continue PT POC, pt is motivated and will likely be ready to d/c home tomorrow.   Follow Up Recommendations  Possibly no f/u PT,  Home with HEP      Equipment Recommendations  Rolling walker with 5" wheels    Recommendations for Other Services       Precautions / Restrictions Precautions Precautions: Fall Restrictions Other Position/Activity Restrictions: WBAT    Mobility  Bed Mobility                  Transfers Overall transfer level: Needs assistance Equipment used: Rolling walker (2 wheeled) Transfers: Sit to/from Stand Sit to Stand: Supervision;Min guard         General transfer comment: cues for hand placement  Ambulation/Gait                 Stairs             Wheelchair Mobility    Modified Rankin (Stroke Patients Only)       Balance                                            Cognition Arousal/Alertness: Awake/alert Behavior During Therapy: WFL for tasks assessed/performed Overall Cognitive Status: Within Functional Limits for tasks assessed                                        Exercises Total Joint Exercises Ankle Circles/Pumps: AROM;Both;10 reps Quad Sets: AROM;Both;10 reps Short Arc Quad: Right;AROM;10 reps Heel Slides: AAROM;Right;10 reps Hip ABduction/ADduction: Right;10 reps;AAROM Long Arc Quad: AROM;Right;10 reps    General Comments        Pertinent Vitals/Pain Pain Assessment: 0-10 Pain Score: 4  Pain Location: right hip Pain Descriptors / Indicators: Burning Pain Intervention(s): Limited activity within patient's tolerance;Monitored during session;Ice applied    Home Living                       Prior Function            PT Goals (current goals can now be found in the care plan section) Acute Rehab PT Goals Patient Stated Goal: less pain PT Goal Formulation: With patient Time For Goal Achievement: 02/24/19 Potential to Achieve Goals: Good Progress towards PT goals: Progressing toward goals    Frequency    7X/week      PT Plan Current plan remains appropriate    Co-evaluation              AM-PAC PT "6 Clicks" Mobility   Outcome Measure  Help needed turning from your back to your side while in a flat bed without using bedrails?: A Little Help needed moving from lying on your back to sitting on the side of a flat bed without using bedrails?: A Little Help needed moving to and from a bed to a chair (including a wheelchair)?: A Little Help needed standing up from a chair using your arms (e.g., wheelchair or bedside chair)?: A Little Help needed  to walk in hospital room?: A Little Help needed climbing 3-5 steps with a railing? : A Little 6 Click Score: 18    End of Session Equipment Utilized During Treatment: Gait belt Activity Tolerance: Patient tolerated treatment well Patient left: in chair;with call bell/phone within reach;with family/visitor present   PT Visit Diagnosis: Difficulty in walking, not elsewhere classified (R26.2)     Time: 0634-9494 PT Time Calculation (min) (ACUTE ONLY): 20 min  Charges:  $Therapeutic Exercise: 8-22 mins                     Kenyon Ana, PT  Pager: [phone] Acute Rehab Dept [facility]): [phone]   02/18/2019    [facility] 02/18/2019, 4:10 PM

## 2019-02-18 NOTE — Progress Notes (Signed)
Physical Therapy Treatment Patient Details Name: Morgan Anthony MRN: [id number] DOB: [birth year] Today's Date: 02/18/2019    History of Present Illness s/p R DA THA    PT Comments    Pt is progressing well, incr gait distance; no nausea today. Continue PT POC  Follow Up Recommendations  Home health PT     Equipment Recommendations  Rolling walker with 5" wheels    Recommendations for Other Services       Precautions / Restrictions Precautions Precautions: Fall Restrictions Weight Bearing Restrictions: No Other Position/Activity Restrictions: WBAT    Mobility  Bed Mobility Overal bed mobility: Needs Assistance Bed Mobility: Supine to Sit;Sit to Supine     Supine to sit: Min assist Sit to supine: Min assist   General bed mobility comments: assist with LEs  Transfers Overall transfer level: Needs assistance Equipment used: Rolling walker (2 wheeled) Transfers: Sit to/from Stand Sit to Stand: Min assist         General transfer comment: cues for hand placement  Ambulation/Gait Ambulation/Gait assistance: Min assist;Min guard Gait Distance (Feet): 60 Feet Assistive device: Rolling walker (2 wheeled) Gait Pattern/deviations: Step-to pattern;Decreased weight shift to right     General Gait Details: cues for sequence, RW position and gait progression   Stairs             Wheelchair Mobility    Modified Rankin (Stroke Patients Only)       Balance                                            Cognition Arousal/Alertness: Awake/alert Behavior During Therapy: WFL for tasks assessed/performed Overall Cognitive Status: Within Functional Limits for tasks assessed                                        Exercises Total Joint Exercises Ankle Circles/Pumps: AROM;Both;10 reps Quad Sets: AROM;Both;10 reps    General Comments        Pertinent Vitals/Pain Pain Assessment: 0-10 Pain Score: 4  Pain Location: right  hip Pain Descriptors / Indicators: Burning Pain Intervention(s): Monitored during session;Limited activity within patient's tolerance;Ice applied    Home Living                      Prior Function            PT Goals (current goals can now be found in the care plan section) Acute Rehab PT Goals Patient Stated Goal: less pain PT Goal Formulation: With patient Time For Goal Achievement: 02/24/19 Potential to Achieve Goals: Good Progress towards PT goals: Progressing toward goals    Frequency    7X/week      PT Plan Current plan remains appropriate    Co-evaluation              AM-PAC PT "6 Clicks" Mobility   Outcome Measure  Help needed turning from your back to your side while in a flat bed without using bedrails?: A Little Help needed moving from lying on your back to sitting on the side of a flat bed without using bedrails?: A Little Help needed moving to and from a bed to a chair (including a wheelchair)?: A Little Help needed standing up from a chair using your arms (e.g., wheelchair  or bedside chair)?: A Little Help needed to walk in hospital room?: A Little Help needed climbing 3-5 steps with a railing? : A Little 6 Click Score: 18    End of Session Equipment Utilized During Treatment: Gait belt Activity Tolerance: Patient tolerated treatment well Patient left: with call bell/phone within reach;in bed;with family/visitor present   PT Visit Diagnosis: Difficulty in walking, not elsewhere classified (R26.2)     Time: 3646-8032 PT Time Calculation (min) (ACUTE ONLY): 29 min  Charges:  $Gait Training: 23-37 mins                     Kenyon Ana, PT  Pager: [phone] Acute Rehab Dept [facility]): [phone]   02/18/2019    [facility] 02/18/2019, 11:52 AM

## 2019-02-18 NOTE — Progress Notes (Signed)
Subjective: 1 Day Post-Op Procedure(s) (LRB): RIGHT TOTAL HIP ARTHROPLASTY ANTERIOR APPROACH (Right) Patient reports pain as moderate.    Objective: Vital signs in last 24 hours: Temp:  [97.5 F (36.4 C)-98.8 F (37.1 C)] 98.8 F (37.1 C) (03/07 0628) Pulse Rate:  [62-101] 101 (03/07 0628) Resp:  [9-17] 17 (03/07 0628) BP: (97-146)/(67-86) 146/86 (03/07 0628) SpO2:  [95 %-100 %] 95 % (03/07 0628)  Intake/Output from previous day: 03/06 0701 - 03/07 0700 In: 3230.8 [P.O.:600; I.V.:2280.9; IV Piggyback:349.9] Out: 1150 [Urine:850; Blood:300] Intake/Output this shift: No intake/output data recorded.  Recent Labs    02/15/19 1153 02/18/19 0322  HGB 14.5 10.0*   Recent Labs    02/15/19 1153 02/18/19 0322  WBC 6.7 6.8  RBC 4.76 3.34*  HCT 44.2 32.5*  PLT 278 177   Recent Labs    02/15/19 1153 02/18/19 0322  NA 135 137  K 4.1 3.7  CL 103 107  CO2 23 25  BUN 28* 25*  CREATININE 0.98 0.89  GLUCOSE 97 117*  CALCIUM 10.0 8.2*   No results for input(s): LABPT, INR in the last 72 hours.  Sensation intact distally Intact pulses distally Dorsiflexion/Plantar flexion intact Incision: dressing C/D/I   Assessment/Plan: 1 Day Post-Op Procedure(s) (LRB): RIGHT TOTAL HIP ARTHROPLASTY ANTERIOR APPROACH (Right) Up with therapy Plan for discharge tomorrow Discharge home with home health      Morgan Anthony 02/18/2019, 9:38 AM

## 2019-02-19 LAB — CBC
HCT: 29.3 % — ABNORMAL LOW (ref 36.0–46.0)
Hemoglobin: 9.2 g/dL — ABNORMAL LOW (ref 12.0–15.0)
MCH: 30.3 pg (ref 26.0–34.0)
MCHC: 31.4 g/dL (ref 30.0–36.0)
MCV: 96.4 fL (ref 80.0–100.0)
Platelets: 141 10*3/uL — ABNORMAL LOW (ref 150–400)
RBC: 3.04 MIL/uL — ABNORMAL LOW (ref 3.87–5.11)
RDW: 11.9 % (ref 11.5–15.5)
WBC: 5.9 10*3/uL (ref 4.0–10.5)
nRBC: 0 % (ref 0.0–0.2)

## 2019-02-19 MED ORDER — OXYCODONE HCL 5 MG PO TABS: 5.0000 mg | ORAL_TABLET | ORAL | 0 refills | Status: DC | PRN

## 2019-02-19 MED ORDER — ASPIRIN 81 MG PO CHEW: 81.0000 mg | CHEWABLE_TABLET | Freq: Two times a day (BID) | ORAL | 0 refills | Status: AC

## 2019-02-19 MED ORDER — METHOCARBAMOL 500 MG PO TABS: 500.0000 mg | ORAL_TABLET | Freq: Four times a day (QID) | ORAL | 0 refills | Status: DC | PRN

## 2019-02-19 NOTE — Progress Notes (Signed)
Doing well.  Hip and vitals stable. Can be discharged to home today.

## 2019-02-19 NOTE — Discharge Summary (Signed)
Patient ID: Morgan Anthony MRN: [id number] DOB/AGE: Aug 31, 1956 63 y.o.  Admit date: 02/17/2019 Discharge date: 02/19/2019  Admission Diagnoses:  Principal Problem:   Unilateral primary osteoarthritis, right hip Active Problems:   Status post total replacement of right hip   Discharge Diagnoses:  Same  Past Medical History:  Diagnosis Date  . Cough   . GERD (gastroesophageal reflux disease)   . Hiatal hernia   . History of kidney stones   . History of urethritis   . Hypertension   . OA (osteoarthritis)    knees and hips    Surgeries: Procedure(s): RIGHT TOTAL HIP ARTHROPLASTY ANTERIOR APPROACH on 02/17/2019   Consultants:   Discharged Condition: Improved  Hospital Course: Morgan Anthony is an 63 y.o. female who was admitted 02/17/2019 for operative treatment ofUnilateral primary osteoarthritis, right hip. Patient has severe unremitting pain that affects sleep, daily activities, and work/hobbies. After pre-op clearance the patient was taken to the operating room on 02/17/2019 and underwent  Procedure(s): RIGHT TOTAL HIP ARTHROPLASTY ANTERIOR APPROACH.    Patient was given perioperative antibiotics:  Anti-infectives (From admission, onward)   Start     Dose/Rate Route Frequency Ordered Stop   02/17/19 1800  ceFAZolin (ANCEF) IVPB 1 g/50 mL premix     1 g 100 mL/hr over 30 Minutes Intravenous Every 6 hours 02/17/19 1517 02/18/19 0017   02/17/19 0900  ceFAZolin (ANCEF) IVPB 2g/100 mL premix     2 g 200 mL/hr over 30 Minutes Intravenous On call to O.R. 02/17/19 0848 02/17/19 1207       Patient was given sequential compression devices, early ambulation, and chemoprophylaxis to prevent DVT.  Patient benefited maximally from hospital stay and there were no complications.    Recent vital signs:  Patient Vitals for the past 24 hrs:  BP Temp Pulse Resp SpO2  02/19/19 0608 122/77 98.5 F (36.9 C) 99 15 95 %  02/18/19 2259 140/78 98 F (36.7 C) 80 16 99 %     Recent laboratory  studies:  Recent Labs    02/18/19 0322 02/19/19 0354  WBC 6.8 5.9  HGB 10.0* 9.2*  HCT 32.5* 29.3*  PLT 177 141*  NA 137  --   K 3.7  --   CL 107  --   CO2 25  --   BUN 25*  --   CREATININE 0.89  --   GLUCOSE 117*  --   CALCIUM 8.2*  --      Discharge Medications:   Allergies as of 02/19/2019      Reactions   Cozaar [losartan Potassium] Shortness Of Breath   Trimethoprim Swelling      Medication List    TAKE these medications   acetaminophen 500 MG tablet Commonly known as:  TYLENOL Take 1,000 mg by mouth every 6 (six) hours as needed for moderate pain.   ALPRAZolam 0.5 MG tablet Commonly known as:  XANAX Take 0.25 mg by mouth at bedtime as needed for sleep.   aspirin 81 MG chewable tablet Chew 1 tablet (81 mg total) by mouth 2 (two) times daily.   atenolol 50 MG tablet Commonly known as:  TENORMIN Take 50 mg by mouth daily.   B-12 1000 MCG/ML Kit Inject 1,000 mcg as directed every 30 (thirty) days.   celecoxib 200 MG capsule Commonly known as:  CELEBREX Take 200 mg by mouth daily.   cephALEXin 250 MG capsule Commonly known as:  KEFLEX Take 250 mg by mouth 2 (two) times a week.  As needed for dysuria   hydrochlorothiazide 25 MG tablet Commonly known as:  HYDRODIURIL Take 25 mg by mouth daily as needed (swelling).   methocarbamol 500 MG tablet Commonly known as:  ROBAXIN Take 1 tablet (500 mg total) by mouth every 6 (six) hours as needed for muscle spasms.   NEXIUM 24HR PO Take 40 mg by mouth daily.   oxyCODONE 5 MG immediate release tablet Commonly known as:  Oxy IR/ROXICODONE Take 1-2 tablets (5-10 mg total) by mouth every 4 (four) hours as needed for moderate pain (pain score 4-6).   Vagifem 10 MCG Tabs vaginal tablet Generic drug:  Estradiol Place 10 mcg vaginally 2 (two) times a week.   Vitamin D (Ergocalciferol) 1.25 MG (50000 UT) Caps capsule Commonly known as:  DRISDOL Take 1 capsule by mouth every 28 (twenty-eight) days.        Diagnostic Studies: Dg Pelvis Portable  Result Date: 02/17/2019 CLINICAL DATA:  Total right hip arthroplasty. EXAM: PORTABLE PELVIS 1-2 VIEWS COMPARISON:  Intraoperative radiograph from the same date. FINDINGS: Total right hip arthroplasty with normal alignment of the orthopedic hardware. No evidence of fracture. Expected postsurgical soft tissue emphysema. IMPRESSION: Status post total right hip arthroplasty without evidence of immediate complications. Electronically Signed   By: Fidela Salisbury M.D.   On: 02/17/2019 15:48   Dg C-arm 1-60 Min-no Report  Result Date: 02/17/2019 Fluoroscopy was utilized by the requesting physician.  No radiographic interpretation.   Dg Hip Operative Unilat With Pelvis Right  Result Date: 02/17/2019 CLINICAL DATA:  Total hip arthroplasty, right. EXAM: OPERATIVE RIGHT HIP (WITH PELVIS IF PERFORMED) TECHNIQUE: Fluoroscopic spot image(s) were submitted for interpretation post-operatively. COMPARISON:  01/18/2019 FINDINGS: Intraoperative fluoroscopic images from total right hip arthroplasty demonstrate placement of screwed in acetabular component and long stem femoral component hardware. Normal alignment. Expected postsurgical changes. Fluoroscopy time 33 seconds. IMPRESSION: Intraoperative fluoroscopic images from total right hip arthroplasty. Electronically Signed   By: Fidela Salisbury M.D.   On: 02/17/2019 15:02    Disposition:     Follow-up Information    Mcarthur Rossetti, MD Follow up in 2 week(s).   Specialty:  Orthopedic Surgery Contact information: [place] [address] [phone]        Home, [facility] Follow up.   Specialty:  [place] Why:  [place] will call to arrange appointment Contact information: [place] [address] [phone]            Signed: Mcarthur Rossetti 02/19/2019, 3:06 PM

## 2019-02-19 NOTE — Progress Notes (Signed)
Physical Therapy Treatment Patient Details Name: Morgan Anthony MRN: [id number] DOB: [birth year] Today's Date: 02/19/2019    History of Present Illness s/p R DA THA    PT Comments    Pt progressing well; ready for d/c from PT standpoint  Follow Up Recommendations  Follow surgeon's recommendation for DC plan and follow-up therapies     Equipment Recommendations  Rolling walker with 5" wheels    Recommendations for Other Services       Precautions / Restrictions Restrictions Weight Bearing Restrictions: No Other Position/Activity Restrictions: WBAT    Mobility  Bed Mobility                  Transfers Overall transfer level: Needs assistance Equipment used: Rolling walker (2 wheeled) Transfers: Sit to/from Stand Sit to Stand: Min guard         General transfer comment: cues for hand placement  Ambulation/Gait Ambulation/Gait assistance: Supervision Gait Distance (Feet): 120 Feet Assistive device: Rolling walker (2 wheeled) Gait Pattern/deviations: Step-to pattern;Decreased weight shift to right     General Gait Details: cues for sequence, RW position and gait progression   Stairs Stairs: Yes Stairs assistance: Min assist;Min guard Stair Management: No rails;Step to pattern;With walker;Backwards Number of Stairs: 2 General stair comments: cues for sequence and technique   Wheelchair Mobility    Modified Rankin (Stroke Patients Only)       Balance                                            Cognition Arousal/Alertness: Awake/alert Behavior During Therapy: WFL for tasks assessed/performed Overall Cognitive Status: Within Functional Limits for tasks assessed                                        Exercises Total Joint Exercises Ankle Circles/Pumps: AROM;Both;10 reps Quad Sets: AROM;Both;10 reps Short Arc Quad: Right;AROM;10 reps Heel Slides: AAROM;Right;10 reps Hip ABduction/ADduction: Right;10  reps;AAROM Long Arc Quad: AROM;Right;10 reps    General Comments        Pertinent Vitals/Pain Pain Assessment: 0-10 Pain Location: right hip Pain Descriptors / Indicators: Sore Pain Intervention(s): Limited activity within patient's tolerance;Monitored during session;Premedicated before session    Home Living                      Prior Function            PT Goals (current goals can now be found in the care plan section) Acute Rehab PT Goals Patient Stated Goal: care for grandkids PT Goal Formulation: With patient Time For Goal Achievement: 02/24/19 Potential to Achieve Goals: Good Progress towards PT goals: Progressing toward goals    Frequency    7X/week      PT Plan Current plan remains appropriate    Co-evaluation              AM-PAC PT "6 Clicks" Mobility   Outcome Measure  Help needed turning from your back to your side while in a flat bed without using bedrails?: A Little Help needed moving from lying on your back to sitting on the side of a flat bed without using bedrails?: A Little Help needed moving to and from a bed to a chair (including a wheelchair)?: A Little Help needed  standing up from a chair using your arms (e.g., wheelchair or bedside chair)?: A Little Help needed to walk in hospital room?: A Little Help needed climbing 3-5 steps with a railing? : A Little 6 Click Score: 18    End of Session Equipment Utilized During Treatment: Gait belt Activity Tolerance: Patient tolerated treatment well Patient left: in chair;with call bell/phone within reach;with family/visitor present   PT Visit Diagnosis: Difficulty in walking, not elsewhere classified (R26.2)     Time: 1308-6578 PT Time Calculation (min) (ACUTE ONLY): 25 min  Charges:  $Gait Training: 23-37 mins                     Kenyon Ana, PT  Pager: [phone] Acute Rehab Dept [facility]): [phone]   02/19/2019    [facility] 02/19/2019, 10:11 AM

## 2019-02-19 NOTE — Plan of Care (Signed)
Pt in stable condition  to d/c home. No needs at this time. No changes to note overall. Pt has all necessary home equipment.

## 2019-02-21 ENCOUNTER — Encounter (HOSPITAL_COMMUNITY): Payer: Self-pay | Admitting: Orthopaedic Surgery

## 2019-02-23 ENCOUNTER — Inpatient Hospital Stay (INDEPENDENT_AMBULATORY_CARE_PROVIDER_SITE_OTHER): Payer: BC Managed Care – PPO | Admitting: Orthopaedic Surgery

## 2019-03-06 ENCOUNTER — Other Ambulatory Visit: Payer: Self-pay

## 2019-03-06 ENCOUNTER — Encounter (INDEPENDENT_AMBULATORY_CARE_PROVIDER_SITE_OTHER): Payer: Self-pay | Admitting: Orthopaedic Surgery

## 2019-03-06 ENCOUNTER — Ambulatory Visit (INDEPENDENT_AMBULATORY_CARE_PROVIDER_SITE_OTHER): Payer: BC Managed Care – PPO | Admitting: Orthopaedic Surgery

## 2019-03-06 DIAGNOSIS — Z96641 Presence of right artificial hip joint: Secondary | ICD-10-CM

## 2019-03-06 MED ORDER — OXYCODONE HCL 5 MG PO TABS: 5.0000 mg | ORAL_TABLET | ORAL | 0 refills | Status: DC | PRN

## 2019-03-06 NOTE — Progress Notes (Signed)
The patient is now 2 weeks status post a right total hip arthroplasty.  She is ambulating with a walker in the office today but is already transition to a cane at home.  She states she is doing well overall and is very happy and pleased.  On examination her right hip incision looks great.  I placed new Steri-Strips.  Her leg lengths are equal.  She tolerates me putting her through range of motion.  At this point she will transition to a cane only when she is comfortable.  She can drive from my standpoint.  I did refill her oxycodone which she is only using at night.  She will wean from this as well.  She can go down to an aspirin once a day for a week and then can stop her aspirin.  All question concerns were answered addressed.  I would like to see her back in 4 weeks for repeat exam but no x-rays are needed.

## 2019-03-13 ENCOUNTER — Other Ambulatory Visit (INDEPENDENT_AMBULATORY_CARE_PROVIDER_SITE_OTHER): Payer: Self-pay | Admitting: Orthopaedic Surgery

## 2019-03-13 NOTE — Telephone Encounter (Signed)
Please advise 

## 2019-04-06 ENCOUNTER — Other Ambulatory Visit: Payer: Self-pay

## 2019-04-06 ENCOUNTER — Ambulatory Visit (INDEPENDENT_AMBULATORY_CARE_PROVIDER_SITE_OTHER): Payer: BC Managed Care – PPO | Admitting: Orthopaedic Surgery

## 2019-04-06 ENCOUNTER — Encounter (INDEPENDENT_AMBULATORY_CARE_PROVIDER_SITE_OTHER): Payer: Self-pay | Admitting: Orthopaedic Surgery

## 2019-04-06 DIAGNOSIS — Z96641 Presence of right artificial hip joint: Secondary | ICD-10-CM

## 2019-04-06 NOTE — Progress Notes (Signed)
HPI: Mrs. Morlock returns now approximately 6 weeks status post right total hip arthroplasty.  She is overall doing well.  She still ambulating with a cane.  She notes at times she does not require a cane.  She has some stiffness whenever she first begins to ambulate though.  She is taking Tylenol for pain no narcotics.  She is happy thus far with the hip replacement.  Review of systems: No fever, chills, shortness of breath or chest pain.  Physical exam: Right hip surgical incisions healing well no signs of infection.  No dehiscence of the wound.  Good fluid range of motion of the right hip.  Right calf supple nontender.  Dorsiflexion plantarflexion ankle intact.  Impression: Status post right total hip arthroplasty 02/17/2019  Plan: She will follow-up with [place] in 1 month to check her progress.  Should follow-up sooner if there is any questions or concerns.  Questions were encouraged and answered at length today.

## 2019-05-03 ENCOUNTER — Ambulatory Visit: Payer: BC Managed Care – PPO | Admitting: Orthopaedic Surgery

## 2019-05-03 ENCOUNTER — Encounter: Payer: Self-pay | Admitting: Orthopaedic Surgery

## 2019-05-03 ENCOUNTER — Other Ambulatory Visit: Payer: Self-pay

## 2019-05-03 DIAGNOSIS — Z96641 Presence of right artificial hip joint: Secondary | ICD-10-CM

## 2019-05-03 NOTE — Progress Notes (Signed)
The patient is now getting close to 3 months status post a right total hip arthroplasty.  She is doing well overall.  She says her range of motion and strength are increasing and the swelling is decreasing.  She is an active 63 year old female.  She says going up and down steps are the toughest but overall she is made great progress.  She has no significant issues.  On exam she tolerates me easily putting her right operative hip through internal and external rotation.  It feels stable.  Her leg lengths are equal.  There is slight sensitivity of her incision but overall she looks good.  At this point she can follow-up in 6 months from now.  At that visit we would like a standing low AP pelvis and a lateral of her right operative hip.  If there is any issues before then she will let us know.  All question concerns were answered and addressed.

## 2019-05-12 ENCOUNTER — Telehealth: Payer: Self-pay | Admitting: *Deleted

## 2019-05-12 NOTE — Telephone Encounter (Signed)
Pt returned call and notified of potential exposure to COVID-19 during recent visit. Pt offered testing which pt declines at this time. Pt states she feels fine and will return call if she changes her mind regarding testing.

## 2019-05-12 NOTE — Telephone Encounter (Signed)
Patient called but no answer. Left voicemail message for pt to return call. Pt will need to be offered COVID-19 testing due to potential exposure during recent visit.

## 2019-10-07 IMAGING — RF DG HIP (WITH PELVIS) OPERATIVE*R*
1 series · 4 of 4 positions shown · non-contrast
Comparison: 01/18/2019

CLINICAL DATA: Total hip arthroplasty, right.

EXAM:
OPERATIVE RIGHT HIP (WITH PELVIS IF PERFORMED)
TECHNIQUE: Fluoroscopic spot image(s) were submitted for interpretation
post-operatively.

[Series 1: unknown protocol · 0.20mm/px · 4 of 4 slices shown]
[im 1/4]
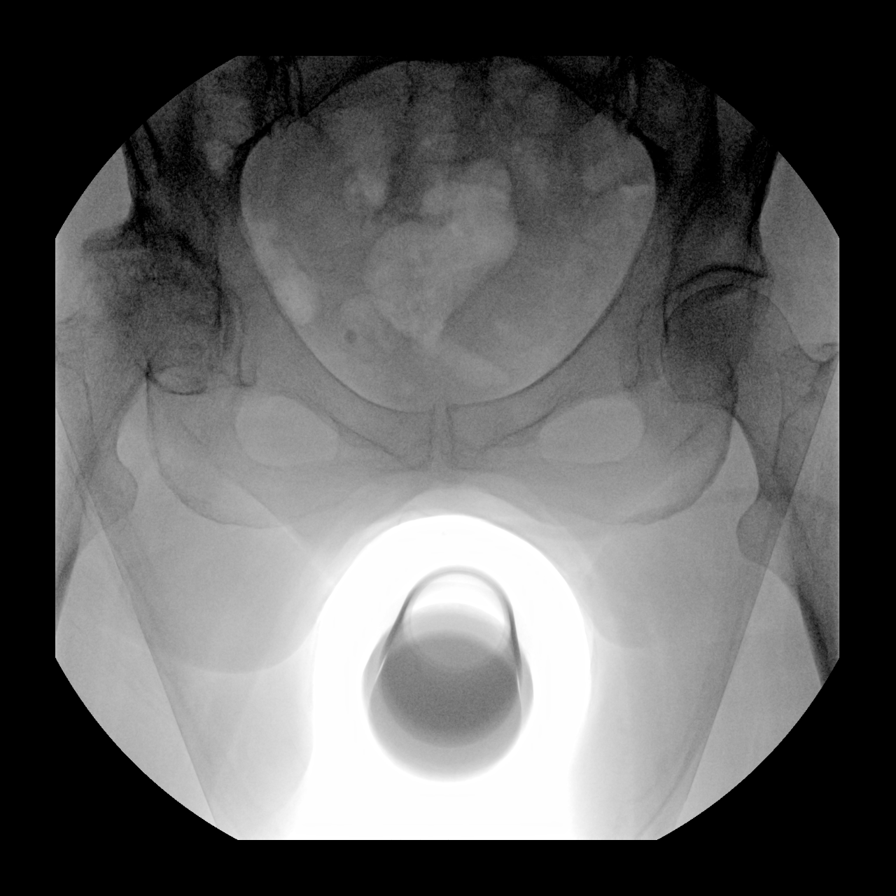
[im 2/4]
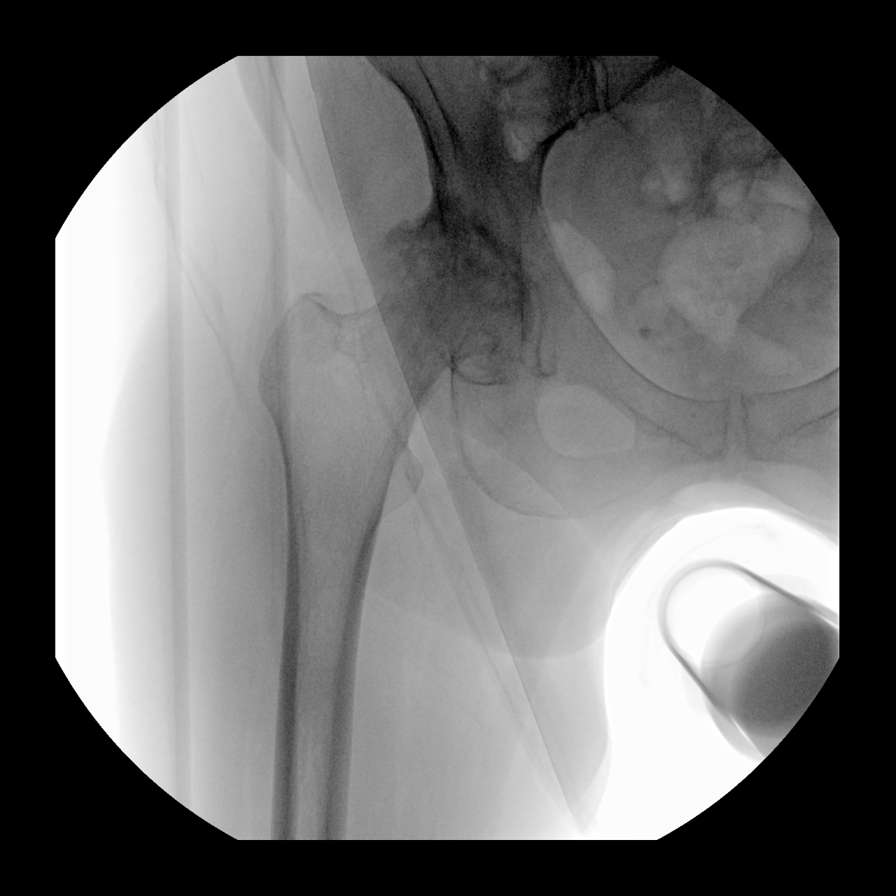
[im 3/4]
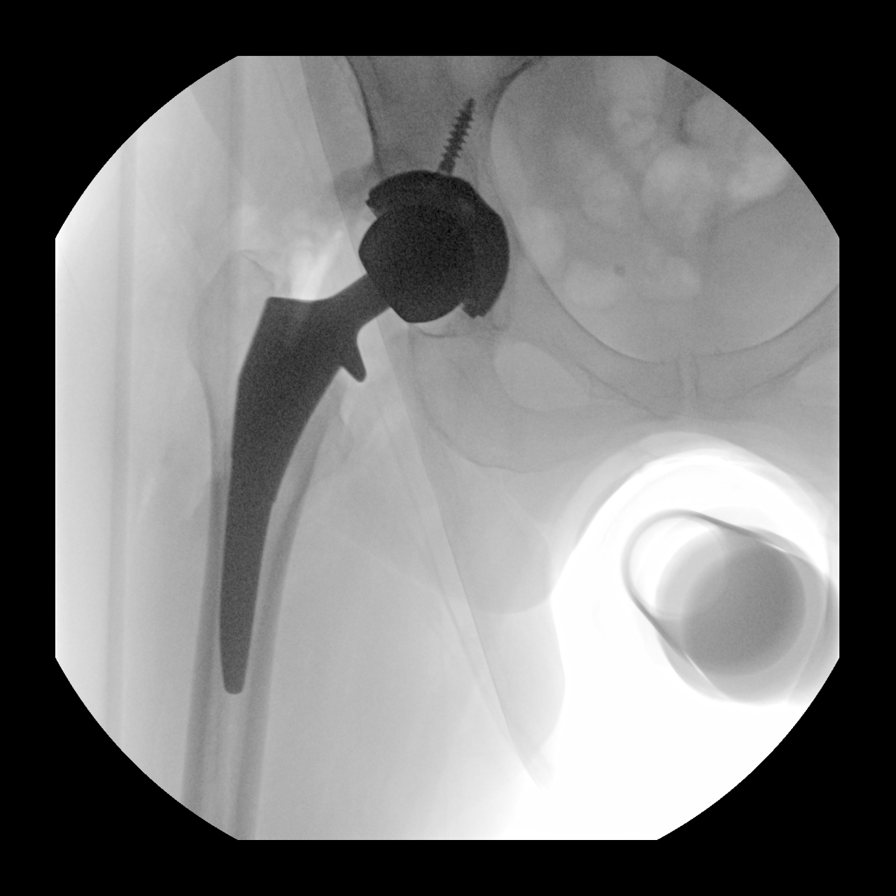
[im 4/4]
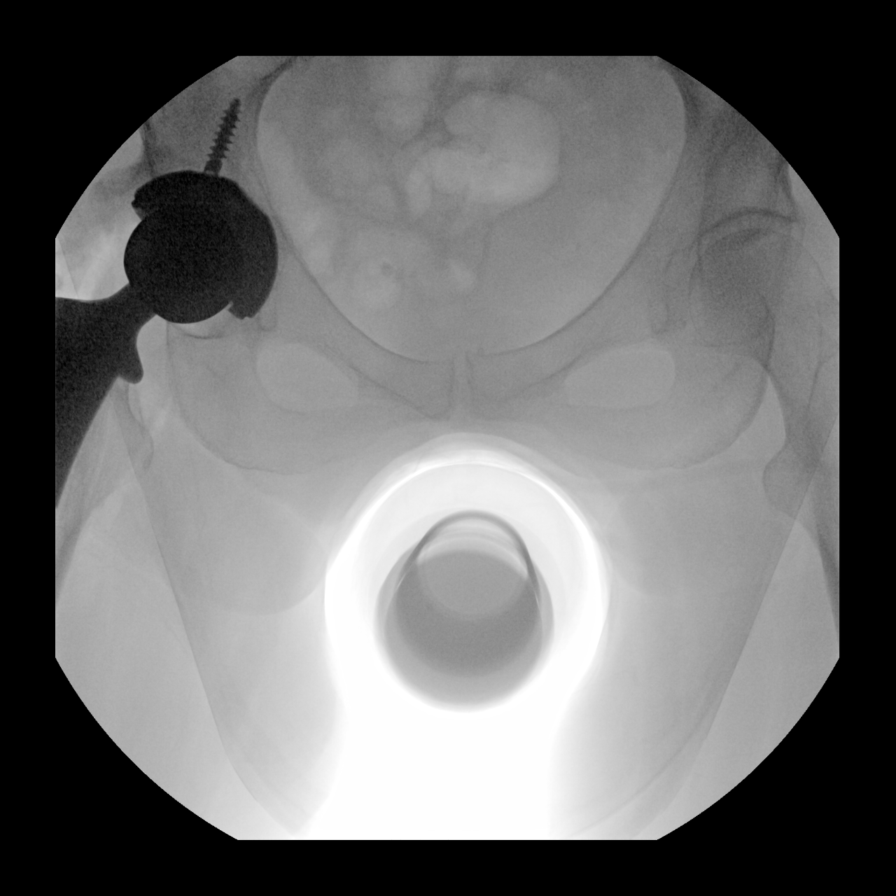

[4 of 4 positions shown; findings below may reference images not displayed]

FINDINGS: Intraoperative fluoroscopic images from total right hip arthroplasty
demonstrate placement of screwed in acetabular component and long
stem femoral component hardware. Normal alignment. Expected
postsurgical changes.

Fluoroscopy time 33 seconds.
IMPRESSION: Intraoperative fluoroscopic images from total right hip
arthroplasty.

## 2019-10-07 IMAGING — DX DG PORTABLE PELVIS
1 series · 1 of 1 positions shown · non-contrast
Comparison: Intraoperative radiograph from the same date.

CLINICAL DATA: Total right hip arthroplasty.

EXAM:
PORTABLE PELVIS 1-2 VIEWS

[pelvis ap]
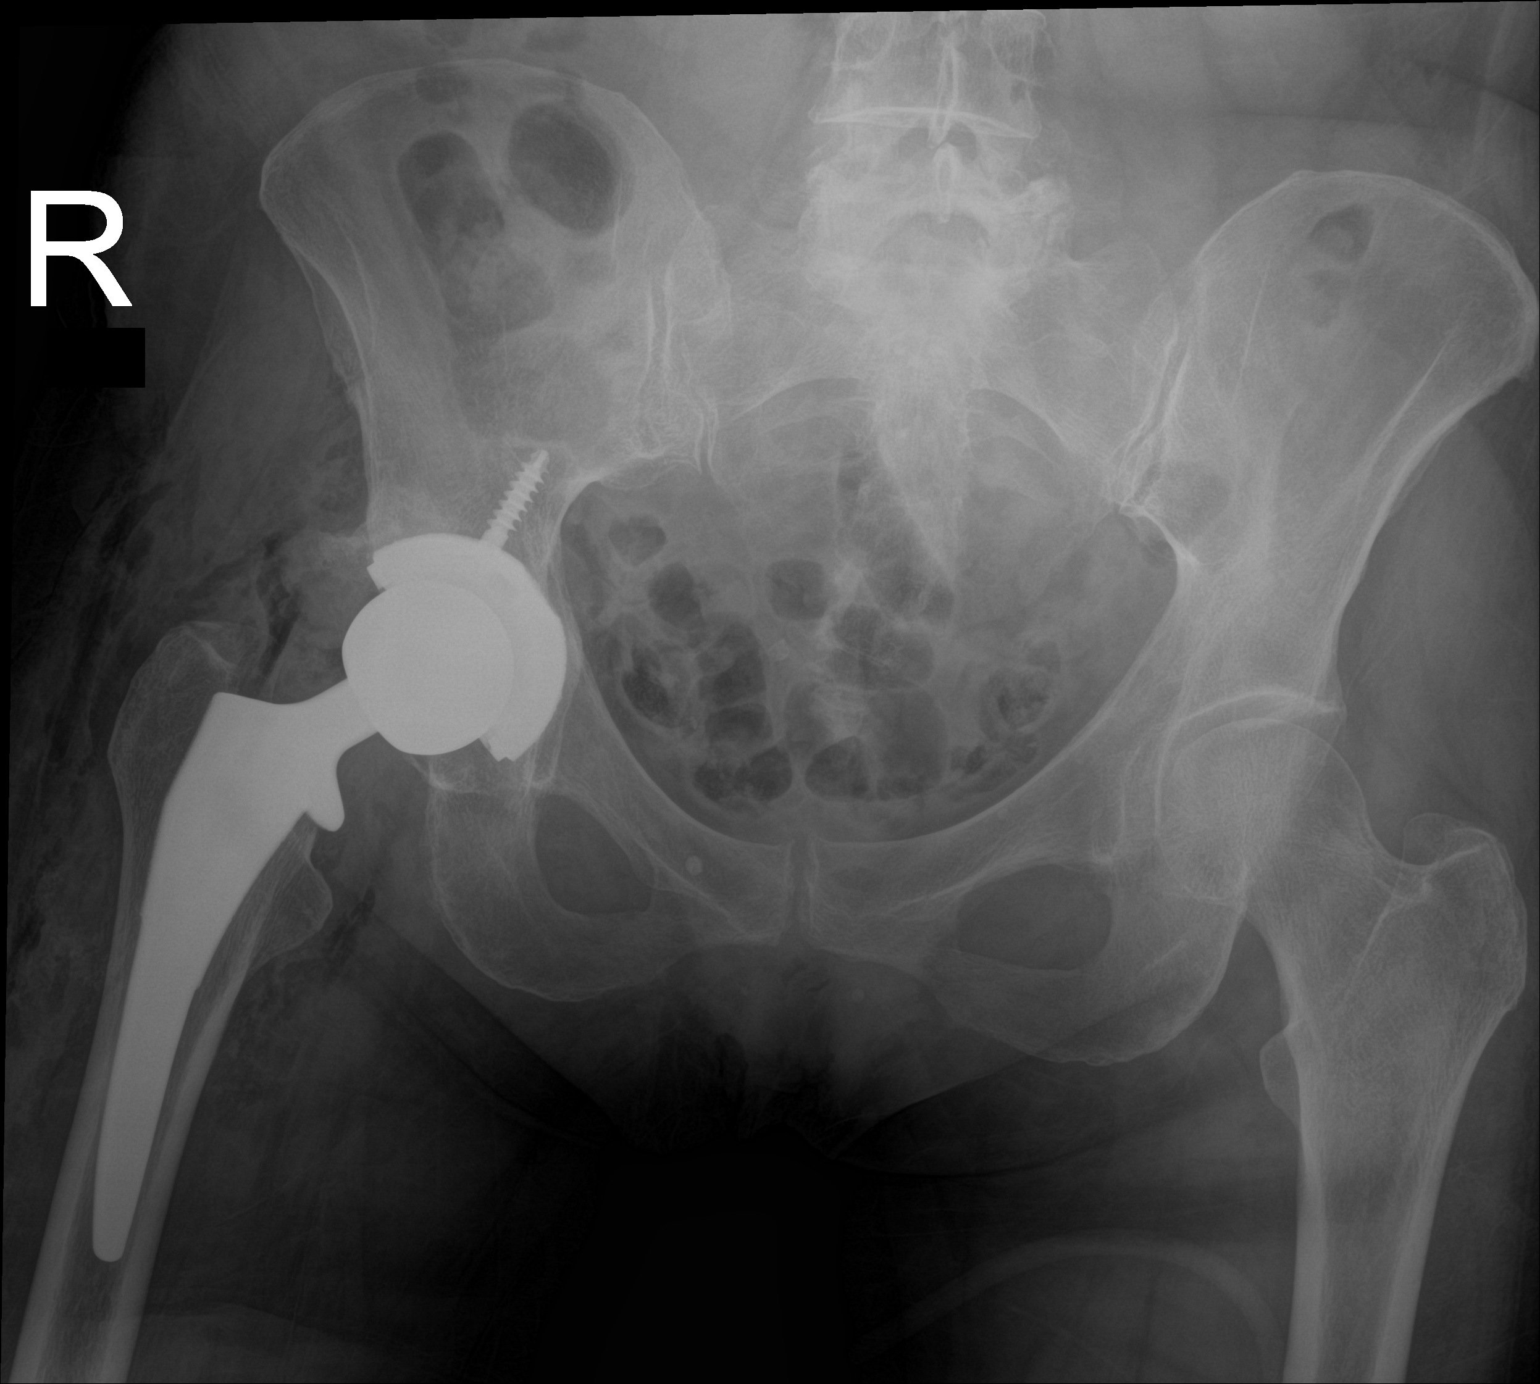

[1 of 1 positions shown; findings below may reference images not displayed]

FINDINGS: Total right hip arthroplasty with normal alignment of the orthopedic
hardware. No evidence of fracture. Expected postsurgical soft tissue
emphysema.
IMPRESSION: Status post total right hip arthroplasty without evidence of
immediate complications.

## 2019-11-02 ENCOUNTER — Ambulatory Visit: Payer: BC Managed Care – PPO | Admitting: Orthopaedic Surgery

## 2019-11-02 ENCOUNTER — Other Ambulatory Visit: Payer: Self-pay

## 2019-11-02 ENCOUNTER — Encounter: Payer: Self-pay | Admitting: Orthopaedic Surgery

## 2019-11-02 ENCOUNTER — Ambulatory Visit: Payer: Self-pay

## 2019-11-02 DIAGNOSIS — Z96641 Presence of right artificial hip joint: Secondary | ICD-10-CM

## 2019-11-02 NOTE — Progress Notes (Signed)
Office Visit Note   Patient: Morgan Anthony           Date of Birth: Aug 13, 1956           MRN: [id number] Visit Date: 11/02/2019              Requested by: Chesley Noon, MD [place],  [place] [address] PCP: Chesley Noon, MD   Assessment & Plan: Visit Diagnoses:  1. History of right hip replacement     Plan:  She will follow-up with [place] on as-needed basis.  Questions encouraged and answered at length today.  She has any questions or concerns she will either call or make an office appointment.  Follow-Up Instructions: Return if symptoms worsen or fail to improve.   Orders:  Orders Placed This Encounter  Procedures  . XR HIP UNILAT W OR W/O PELVIS 1V RIGHT   No orders of the defined types were placed in this encounter.     Procedures: No procedures performed   Clinical Data: No additional findings.   Subjective: Chief Complaint  Patient presents with  . Right Hip - Follow-up    HPI Morgan Anthony 63 year old female comes in today 8 months status post right total hip arthroplasty.  She states she is overall doing very well she is pleased with the hip.  He has some difficulty with steps but otherwise she her only other complaint is some sensitivity of the hip is struck up against something.  Review of Systems Negative for fevers or chills  Objective: Vital Signs: There were no vitals taken for this visit.  Physical Exam General: Well-developed well-nourished female no acute distress mood affect appropriate. Psych alert and oriented x3 Ortho Exam Right hip good range of motion without pain.  Ambulates without any assistive device.  Right calf supple nontender.  Dorsiflexion plantarflexion ankle intact. Specialty Comments:  No specialty comments available.  Imaging: Xr Hip Unilat W Or W/o Pelvis 1v Right  Result Date: 11/02/2019 AP pelvis and lateral view right hip: No acute fracture.  No hardware failure or loosening.  Bilateral hips well  located.  Left hip is well preserved.    PMFS History: Patient Active Problem List   Diagnosis Date Noted  . Status post total replacement of right hip 02/17/2019  . Unilateral primary osteoarthritis, right hip 01/18/2019   Past Medical History:  Diagnosis Date  . Cough   . GERD (gastroesophageal reflux disease)   . Hiatal hernia   . History of kidney stones   . History of urethritis   . Hypertension   . OA (osteoarthritis)    knees and hips    Family History  Problem Relation Age of Onset  . Colon cancer Neg Hx   . Esophageal cancer Neg Hx   . Rectal cancer Neg Hx   . Stomach cancer Neg Hx     Past Surgical History:  Procedure Laterality Date  . CESAREAN SECTION  1985  . COLONOSCOPY WITH PROPOFOL  01/2019  . [place];  07-25-2015  @[facility]  by dr Janice Norrie  . KNEE ARTHROSCOPY Bilateral 2007  . LAPAROSCOPIC CHOLECYSTECTOMY  2007  . TOTAL HIP ARTHROPLASTY Right 02/17/2019   Procedure: RIGHT TOTAL HIP ARTHROPLASTY ANTERIOR APPROACH;  Surgeon: Mcarthur Rossetti, MD;  Location: [facility] ORS;  Service: Orthopedics;  Laterality: Right;   Social History   Occupational History  . Not on file  Tobacco Use  . Smoking status: Never Smoker  .  Smokeless tobacco: Never Used  Substance and Sexual Activity  . Alcohol use: No  . Drug use: Never  . Sexual activity: Not on file

## 2020-01-02 ENCOUNTER — Ambulatory Visit: Payer: BC Managed Care – PPO | Attending: Internal Medicine

## 2020-01-02 DIAGNOSIS — Z23 Encounter for immunization: Secondary | ICD-10-CM | POA: Diagnosis present

## 2020-01-02 NOTE — Progress Notes (Signed)
   Covid-19 Vaccination Clinic  Name:  Morgan Anthony    MRN: [id number] DOB: [birth year]  01/02/2020  [name] was observed post Covid-19 immunization for 15 minutes without incidence. She was provided with Vaccine Information Sheet and instruction to access the V-Safe system.   [name] was instructed to call 911 with any severe reactions post vaccine: [name] Kitchen Difficulty breathing  . Swelling of your face and throat  . A fast heartbeat  . A bad rash all over your body  . Dizziness and weakness    Immunizations Administered    Name Date Dose VIS Date Route   Pfizer COVID-19 Vaccine 01/02/2020 12:26 PM 0.3 mL 11/24/2019 Intramuscular   Manufacturer: [place]   Lot: [id number]   [place]: [id number]

## 2020-01-23 ENCOUNTER — Ambulatory Visit: Payer: BC Managed Care – PPO | Attending: Internal Medicine

## 2020-01-23 DIAGNOSIS — Z23 Encounter for immunization: Secondary | ICD-10-CM

## 2020-01-23 NOTE — Progress Notes (Signed)
   Covid-19 Vaccination Clinic  Name:  NILE SHEDDEN    MRN: [id number] DOB: June 06, 1956  01/23/2020  Ms. Naval was observed post Covid-19 immunization for 15 minutes without incidence. She was provided with Vaccine Information Sheet and instruction to access the V-Safe system.   Ms. Buerger was instructed to call 911 with any severe reactions post vaccine: [name] Kitchen Difficulty breathing  . Swelling of your face and throat  . A fast heartbeat  . A bad rash all over your body  . Dizziness and weakness    Immunizations Administered    Name Date Dose VIS Date Route   Pfizer COVID-19 Vaccine 01/23/2020 12:42 PM 0.3 mL 11/24/2019 Intramuscular   Manufacturer: [place]   Lot: [id number]   [place]: [id number]

## 2023-03-02 ENCOUNTER — Other Ambulatory Visit: Payer: Self-pay | Admitting: Radiology

## 2023-03-11 ENCOUNTER — Ambulatory Visit: Payer: Medicare PPO | Admitting: Orthopaedic Surgery

## 2023-03-11 ENCOUNTER — Encounter: Payer: Self-pay | Admitting: Orthopaedic Surgery

## 2023-03-11 DIAGNOSIS — M533 Sacrococcygeal disorders, not elsewhere classified: Secondary | ICD-10-CM

## 2023-03-11 NOTE — Progress Notes (Signed)
The patient is actually well-known to me.  We replaced her right hip in 2020.  She comes in today because 2 weeks ago she was using the bathroom and felt something "pop" around her coccyx area.  She was not in a lot of pain and she said it was just tender and she had some fullness in the area which is now subsiding.  She says her hip seems fine and she is not in the discomfort that she was having 2 weeks ago.  On exam there is some slight tenderness over the coccyx area but is not severe.  We did not image this today.  I gave her reassurance that this should improve since she is already feeling better.  There is really not much that I would recommend for this area at all unless she was having enough problems but I have told her that she can try to rub Voltaren gel in this area once or twice daily.  She did let me know that she is starting to have issues with her knees.  We will set up an appointment for her to come see [place] in 4 weeks.  At that visit I would like a standing AP and lateral both knees and we can have an AP pelvis at that visit as well standing.

## 2023-03-29 ENCOUNTER — Ambulatory Visit: Payer: Self-pay | Admitting: Surgery

## 2023-03-29 DIAGNOSIS — N6311 Unspecified lump in the right breast, upper outer quadrant: Secondary | ICD-10-CM

## 2023-04-08 ENCOUNTER — Other Ambulatory Visit: Payer: Self-pay

## 2023-04-08 ENCOUNTER — Ambulatory Visit: Payer: Medicare PPO | Admitting: Physician Assistant

## 2023-04-08 ENCOUNTER — Other Ambulatory Visit (INDEPENDENT_AMBULATORY_CARE_PROVIDER_SITE_OTHER): Payer: Medicare PPO

## 2023-04-08 ENCOUNTER — Encounter: Payer: Self-pay | Admitting: Physician Assistant

## 2023-04-08 DIAGNOSIS — M533 Sacrococcygeal disorders, not elsewhere classified: Secondary | ICD-10-CM

## 2023-04-08 DIAGNOSIS — M25561 Pain in right knee: Secondary | ICD-10-CM

## 2023-04-08 DIAGNOSIS — M25562 Pain in left knee: Secondary | ICD-10-CM

## 2023-04-08 MED ORDER — LIDOCAINE HCL 1 % IJ SOLN
3.0000 mL | INTRAMUSCULAR | Status: AC | PRN
Start: 2023-04-08 — End: 2023-04-08
  Administered 2023-04-08: 3 mL

## 2023-04-08 MED ORDER — METHYLPREDNISOLONE ACETATE 40 MG/ML IJ SUSP
40.0000 mg | INTRAMUSCULAR | Status: AC | PRN
Start: 2023-04-08 — End: 2023-04-08
  Administered 2023-04-08: 40 mg via INTRA_ARTICULAR

## 2023-04-08 NOTE — Progress Notes (Signed)
Office Visit Note   Patient: Morgan Anthony           Date of Birth: 1956-09-03           MRN: [id number] Visit Date: 04/08/2023              Requested by: Eartha Inch, MD [address] [place],  [place] [id number] PCP: Eartha Inch, MD  Chief Complaint  Patient presents with  . Left Knee - Follow-up, Pain  . Right Knee - Follow-up, Pain  . coccyx pain      HPI: [name] is a pleasant 67 year old woman who is a patient of Dr. Eliberto Ivory.  She is status post right hip replacement done at [facility] several years ago.  She comes in today for follow-up.  She has seen Dr. Magnus Ivan complaining of coccyx pain after feeling a pop.  He had recommended x-rays of her pelvis today.  She actually says she is doing much better and only has difficulties when she has been sitting in a car for a long time.  Also comes in for evaluation of her knees.  She is status post bilateral arthroscopies of her knees done approximately 18 to 20 years ago.  She now is getting more pain and discomfort.  She has specially has difficulties going up and down stairs  Assessment & Plan: Visit Diagnoses:  1. Coccyx pain   2. Right knee pain, unspecified chronicity   3. Left knee pain, unspecified chronicity     Plan: Coccyx pain continue to treat symptomatically if she is doing better can obviously follow-up if this returns. 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  She is not diabetic.  She is inquiring about steroid injections I think we can go forward with this today.  She is having a benign cyst removed from her breast in 3 weeks and wants to make sure that that would be okay.  Given the time interval from the injections to her procedure I think that be fine.  If she continues to have problems with her knees that steroid does not help she can consider viscosupplementation, which she could discuss with Dr. Magnus Ivan or Delane Ginger  Follow-Up  Instructions: Return if symptoms worsen or fail to improve.   Ortho Exam  Patient is alert, oriented, no adenopathy, well-dressed, normal affect, normal respiratory effort. Bilateral knees she does have mild varus malalignment more right than left.  No effusion no erythema compartments are soft and nontender she ambulates with a normal gait.  Distally she is neurovascular intact she does have grinding with range of motion  Imaging: XR Knee 1-2 Views Right  Result Date: 04/08/2023 Three-view radiographs of the right knee were obtained today she has varus malalignment and tricompartmental arthritis most notably in the medial compartment where she is near bone-on-bone.  Also in the patellofemoral compartment similar.  XR Knee 1-2 Views Left  Result Date: 04/08/2023 Radiographs of her left knee were obtained today she does have tricompartmental arthritis with early varus malalignment.  She has almost complete loss of joint space especially medially in the patellofemoral joint.  No acute fracture she has periarticular osteophytes  XR Pelvis 1-2 Views  Result Date: 04/08/2023 AP pelvis taken today demonstrates status post right total hip replacement in good position within any significant changes from previous x-rays in 2020.  Has slight angulation of her sacrum and coccyx but do not see anything acute.  No  images are attached to the encounter.  Labs: No results found for: "HGBA1C", "ESRSEDRATE", "CRP", "LABURIC", "REPTSTATUS", "GRAMSTAIN", "CULT", "LABORGA"   No results found for: "ALBUMIN", "PREALBUMIN", "CBC"  No results found for: "MG" No results found for: "VD25OH"  No results found for: "PREALBUMIN"    Latest Ref Rng & Units 02/19/2019    3:54 AM 02/18/2019    3:22 AM 02/15/2019   11:53 AM  CBC EXTENDED  WBC 4.0 - 10.5 K/uL 5.9  6.8  6.7   RBC 3.87 - 5.11 MIL/uL 3.04  3.34  4.76   Hemoglobin 12.0 - 15.0 g/dL 9.2  16.1  09.6   HCT 04.5 - 46.0 % 29.3  32.5  44.2   Platelets 150 - 400  K/uL 141  177  278      There is no height or weight on file to calculate BMI.  Orders:  Orders Placed This Encounter  Procedures  . XR Knee 1-2 Views Right  . XR Knee 1-2 Views Left  . XR Pelvis 1-2 Views   No orders of the defined types were placed in this encounter.    Procedures: Large Joint Inj: bilateral knee on 04/08/2023 11:25 AM Indications: pain and diagnostic evaluation Details: 25 G 1.5 in needle, anteromedial approach  Arthrogram: No  Medications (Right): 3 mL lidocaine 1 %; 40 mg methylPREDNISolone acetate 40 MG/ML Medications (Left): 3 mL lidocaine 1 %; 40 mg methylPREDNISolone acetate 40 MG/ML Outcome: tolerated well, no immediate complications Procedure, treatment alternatives, risks and benefits explained, specific risks discussed. Consent was given by the patient.    Clinical Data: No additional findings.  ROS:  All other systems negative, except as noted in the HPI. Review of Systems  Objective: Vital Signs: There were no vitals taken for this visit.  Specialty Comments:  No specialty comments available.  PMFS History: Patient Active Problem List   Diagnosis Date Noted  . 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 Cough   . GERD (gastroesophageal reflux disease)   . Hiatal hernia   . History of kidney stones   . History of urethritis   . Hypertension   . OA (osteoarthritis)    knees and hips    Family History  Problem Relation Age of Onset  . Colon cancer Neg Hx   . Esophageal cancer Neg Hx   . Rectal cancer Neg Hx   . Stomach cancer Neg Hx     Past Surgical History:  Procedure Laterality Date  . CESAREAN SECTION  1985  . COLONOSCOPY WITH PROPOFOL  01/2019  . EXTRACORPOREAL SHOCK WAVE LITHOTRIPSY  1993;  07-25-2015   by dr Brunilda Payor  . KNEE ARTHROSCOPY Bilateral 2007  . LAPAROSCOPIC CHOLECYSTECTOMY  2007  . TOTAL HIP ARTHROPLASTY Right 02/17/2019   Procedure: RIGHT TOTAL HIP  ARTHROPLASTY ANTERIOR APPROACH;  Surgeon: Kathryne Hitch, MD;  Location: [facility];  Service: Orthopedics;  Laterality: Right;   Social History   Occupational History  . Not on file  Tobacco Use  . Smoking status: Never  . Smokeless tobacco: Never  Substance and Sexual Activity  . Alcohol use: No  . Drug use: Never  . Sexual activity: Not on file

## 2023-04-20 ENCOUNTER — Other Ambulatory Visit: Payer: Self-pay

## 2023-04-20 ENCOUNTER — Encounter (HOSPITAL_BASED_OUTPATIENT_CLINIC_OR_DEPARTMENT_OTHER): Payer: Self-pay | Admitting: Surgery

## 2023-04-23 ENCOUNTER — Encounter (HOSPITAL_BASED_OUTPATIENT_CLINIC_OR_DEPARTMENT_OTHER)
Admission: RE | Admit: 2023-04-23 | Discharge: 2023-04-23 | Disposition: A | Discharge status: Home / Self Care | Payer: Medicare PPO | Source: Ambulatory Visit | Attending: Surgery | Admitting: Surgery

## 2023-04-23 DIAGNOSIS — Z01818 Encounter for other preprocedural examination: Secondary | ICD-10-CM | POA: Diagnosis present

## 2023-04-23 LAB — BASIC METABOLIC PANEL
Anion gap: 11 (ref 5–15)
BUN: 20 mg/dL (ref 8–23)
CO2: 23 mmol/L (ref 22–32)
Calcium: 9.6 mg/dL (ref 8.9–10.3)
Chloride: 103 mmol/L (ref 98–111)
Creatinine, Ser: 0.97 mg/dL (ref 0.44–1.00)
GFR, Estimated: >60 mL/min (ref >60)
Glucose, Bld: 130 mg/dL — ABNORMAL HIGH (ref 70–99)
Potassium: 3.4 mmol/L — ABNORMAL LOW (ref 3.5–5.1)
Sodium: 137 mmol/L (ref 135–145)

## 2023-04-23 MED ORDER — CHLORHEXIDINE GLUCONATE CLOTH 2 % EX PADS: 6.0000 | MEDICATED_PAD | Freq: Once | CUTANEOUS | Status: DC

## 2023-04-23 NOTE — Progress Notes (Signed)

## 2023-04-26 NOTE — Anesthesia Preprocedure Evaluation (Signed)
Anesthesia Evaluation  Patient identified by MRN, date of birth, ID band Patient awake    Reviewed: Allergy & Precautions, NPO status , Patient's Chart, lab work & pertinent test results  History of Anesthesia Complications Negative for: history of anesthetic complications  Airway Mallampati: II  TM Distance: >3 FB Neck ROM: Full    Dental  (+) Dental Advisory Given   Pulmonary neg pulmonary ROS   Pulmonary exam normal breath sounds clear to auscultation       Cardiovascular hypertension, (-) angina (-) Past MI, (-) Cardiac Stents and (-) CABG (-) dysrhythmias  Rhythm:Regular Rate:Normal     Neuro/Psych negative neurological ROS     GI/Hepatic Neg liver ROS, hiatal hernia,GERD  Medicated,,  Endo/Other  negative endocrine ROS    Renal/GU Renal disease (h/o nephrolithiasis)     Musculoskeletal  (+) Arthritis , Osteoarthritis,    Abdominal   Peds  Hematology negative hematology ROS (+)   Anesthesia Other Findings   Reproductive/Obstetrics                             Anesthesia Physical Anesthesia Plan  ASA: 2  Anesthesia Plan: General   Post-op Pain Management: Tylenol PO (pre-op)*   Induction: Intravenous  PONV Risk Score and Plan: 3 and Ondansetron, Dexamethasone and Treatment may vary due to age or medical condition  Airway Management Planned: LMA  Additional Equipment:   Intra-op Plan:   Post-operative Plan: Extubation in OR  Informed Consent: I have reviewed the patients History and Physical, chart, labs and discussed the procedure including the risks, benefits and alternatives for the proposed anesthesia with the patient or authorized representative who has indicated his/her understanding and acceptance.     Dental advisory given  Plan Discussed with: CRNA and Anesthesiologist  Anesthesia Plan Comments: (Risks of general anesthesia discussed including, but not  limited to, sore throat, hoarse voice, chipped/damaged teeth, injury to vocal cords, nausea and vomiting, allergic reactions, lung infection, heart attack, stroke, and death. All questions answered. )       Anesthesia Quick Evaluation

## 2023-04-27 ENCOUNTER — Encounter (HOSPITAL_BASED_OUTPATIENT_CLINIC_OR_DEPARTMENT_OTHER)
Admission: RE | Disposition: A | Discharge status: Home / Self Care | Payer: Self-pay | Source: Home / Self Care | Attending: Surgery

## 2023-04-27 ENCOUNTER — Other Ambulatory Visit: Payer: Self-pay

## 2023-04-27 ENCOUNTER — Ambulatory Visit (HOSPITAL_BASED_OUTPATIENT_CLINIC_OR_DEPARTMENT_OTHER)
Admission: RE | Admit: 2023-04-27 | Discharge: 2023-04-27 | Disposition: A | Discharge status: Home / Self Care | Payer: Medicare PPO | Attending: Surgery | Admitting: Surgery

## 2023-04-27 ENCOUNTER — Ambulatory Visit (HOSPITAL_BASED_OUTPATIENT_CLINIC_OR_DEPARTMENT_OTHER): Payer: Medicare PPO | Admitting: Anesthesiology

## 2023-04-27 ENCOUNTER — Encounter (HOSPITAL_BASED_OUTPATIENT_CLINIC_OR_DEPARTMENT_OTHER): Payer: Self-pay | Admitting: Surgery

## 2023-04-27 DIAGNOSIS — I10 Essential (primary) hypertension: Secondary | ICD-10-CM | POA: Insufficient documentation

## 2023-04-27 DIAGNOSIS — N6489 Other specified disorders of breast: Secondary | ICD-10-CM | POA: Diagnosis not present

## 2023-04-27 DIAGNOSIS — N6081 Other benign mammary dysplasias of right breast: Secondary | ICD-10-CM | POA: Diagnosis not present

## 2023-04-27 DIAGNOSIS — N6311 Unspecified lump in the right breast, upper outer quadrant: Secondary | ICD-10-CM

## 2023-04-27 DIAGNOSIS — Z79899 Other long term (current) drug therapy: Secondary | ICD-10-CM | POA: Diagnosis not present

## 2023-04-27 DIAGNOSIS — K219 Gastro-esophageal reflux disease without esophagitis: Secondary | ICD-10-CM | POA: Diagnosis not present

## 2023-04-27 DIAGNOSIS — N6011 Diffuse cystic mastopathy of right breast: Secondary | ICD-10-CM | POA: Insufficient documentation

## 2023-04-27 DIAGNOSIS — K449 Diaphragmatic hernia without obstruction or gangrene: Secondary | ICD-10-CM | POA: Diagnosis not present

## 2023-04-27 DIAGNOSIS — N6021 Fibroadenosis of right breast: Secondary | ICD-10-CM | POA: Diagnosis not present

## 2023-04-27 DIAGNOSIS — Z01818 Encounter for other preprocedural examination: Secondary | ICD-10-CM

## 2023-04-27 DIAGNOSIS — M1990 Unspecified osteoarthritis, unspecified site: Secondary | ICD-10-CM | POA: Diagnosis not present

## 2023-04-27 HISTORY — PX: BREAST LUMPECTOMY WITH RADIOACTIVE SEED LOCALIZATION: SHX6424

## 2023-04-27 SURGERY — BREAST LUMPECTOMY WITH RADIOACTIVE SEED LOCALIZATION
Anesthesia: General | Site: Breast | Laterality: Right

## 2023-04-27 MED ORDER — ACETAMINOPHEN 500 MG PO TABS
1000.0000 mg | ORAL_TABLET | ORAL | Status: AC
  Administered 2023-04-27: 1000 mg via ORAL

## 2023-04-27 MED ORDER — OXYCODONE HCL 5 MG PO TABS: 5.0000 mg | ORAL_TABLET | Freq: Four times a day (QID) | ORAL | 0 refills | Status: AC | PRN

## 2023-04-27 MED ORDER — ACETAMINOPHEN 500 MG PO TABS
ORAL_TABLET | ORAL | Status: AC
  Filled 2023-04-27: qty 2

## 2023-04-27 MED ORDER — IBUPROFEN 800 MG PO TABS
800.0000 mg | ORAL_TABLET | Freq: Three times a day (TID) | ORAL | 0 refills | Status: AC | PRN
Start: 2023-04-27 — End: ?

## 2023-04-27 MED ORDER — DEXAMETHASONE SODIUM PHOSPHATE 10 MG/ML IJ SOLN
INTRAMUSCULAR | Status: DC | PRN
  Administered 2023-04-27: 5 mg via INTRAVENOUS

## 2023-04-27 MED ORDER — BUPIVACAINE HCL (PF) 0.25 % IJ SOLN
INTRAMUSCULAR | Status: DC | PRN
  Administered 2023-04-27: 20 mL

## 2023-04-27 MED ORDER — MIDAZOLAM HCL 2 MG/2ML IJ SOLN
INTRAMUSCULAR | Status: AC
  Filled 2023-04-27: qty 2

## 2023-04-27 MED ORDER — AMISULPRIDE (ANTIEMETIC) 5 MG/2ML IV SOLN: 10.0000 mg | Freq: Once | INTRAVENOUS | Status: DC | PRN

## 2023-04-27 MED ORDER — MIDAZOLAM HCL 2 MG/2ML IJ SOLN
INTRAMUSCULAR | Status: DC | PRN
  Administered 2023-04-27: 2 mg via INTRAVENOUS

## 2023-04-27 MED ORDER — OXYCODONE HCL 5 MG PO TABS: 5.0000 mg | ORAL_TABLET | Freq: Once | ORAL | Status: DC | PRN

## 2023-04-27 MED ORDER — PROPOFOL 500 MG/50ML IV EMUL
INTRAVENOUS | Status: DC | PRN
  Administered 2023-04-27: 35 ug/kg/min via INTRAVENOUS

## 2023-04-27 MED ORDER — FENTANYL CITRATE (PF) 100 MCG/2ML IJ SOLN
INTRAMUSCULAR | Status: AC
  Filled 2023-04-27: qty 2

## 2023-04-27 MED ORDER — OXYCODONE HCL 5 MG/5ML PO SOLN: 5.0000 mg | Freq: Once | ORAL | Status: DC | PRN

## 2023-04-27 MED ORDER — LACTATED RINGERS IV SOLN: INTRAVENOUS | Status: DC

## 2023-04-27 MED ORDER — SODIUM CHLORIDE 0.9 % IV SOLN
INTRAVENOUS | Status: AC
  Filled 2023-04-27: qty 10

## 2023-04-27 MED ORDER — FENTANYL CITRATE (PF) 100 MCG/2ML IJ SOLN
INTRAMUSCULAR | Status: DC | PRN
  Administered 2023-04-27: 25 ug via INTRAVENOUS
  Administered 2023-04-27: 50 ug via INTRAVENOUS

## 2023-04-27 MED ORDER — SODIUM CHLORIDE 0.9 % IV SOLN
INTRAVENOUS | Status: DC | PRN
  Administered 2023-04-27: 100 mL

## 2023-04-27 MED ORDER — DEXAMETHASONE SODIUM PHOSPHATE 10 MG/ML IJ SOLN
INTRAMUSCULAR | Status: AC
  Filled 2023-04-27: qty 1

## 2023-04-27 MED ORDER — LIDOCAINE 2% (20 MG/ML) 5 ML SYRINGE
INTRAMUSCULAR | Status: AC
  Filled 2023-04-27: qty 5

## 2023-04-27 MED ORDER — EPHEDRINE SULFATE (PRESSORS) 50 MG/ML IJ SOLN
INTRAMUSCULAR | Status: DC | PRN
  Administered 2023-04-27: 15 mg via INTRAVENOUS

## 2023-04-27 MED ORDER — FENTANYL CITRATE (PF) 100 MCG/2ML IJ SOLN
25.0000 ug | INTRAMUSCULAR | Status: DC | PRN
  Administered 2023-04-27 (×2): 25 ug via INTRAVENOUS

## 2023-04-27 MED ORDER — ONDANSETRON HCL 4 MG/2ML IJ SOLN
INTRAMUSCULAR | Status: DC | PRN
  Administered 2023-04-27: 4 mg via INTRAVENOUS

## 2023-04-27 MED ORDER — FAMOTIDINE IN NACL 20-0.9 MG/50ML-% IV SOLN
20.0000 mg | Freq: Once | INTRAVENOUS | Status: AC
  Administered 2023-04-27: 20 mg via INTRAVENOUS

## 2023-04-27 MED ORDER — CEFAZOLIN SODIUM-DEXTROSE 2-4 GM/100ML-% IV SOLN
INTRAVENOUS | Status: AC
  Filled 2023-04-27: qty 100

## 2023-04-27 MED ORDER — PROPOFOL 10 MG/ML IV BOLUS
INTRAVENOUS | Status: DC | PRN
  Administered 2023-04-27: 150 mg via INTRAVENOUS

## 2023-04-27 MED ORDER — LACTATED RINGERS IV SOLN: INTRAVENOUS | Status: DC | PRN

## 2023-04-27 MED ORDER — CEFAZOLIN SODIUM-DEXTROSE 2-4 GM/100ML-% IV SOLN: 2.0000 g | INTRAVENOUS | Status: DC

## 2023-04-27 MED ORDER — CEFAZOLIN SODIUM-DEXTROSE 2-3 GM-%(50ML) IV SOLR
INTRAVENOUS | Status: DC | PRN
  Administered 2023-04-27: 2 g via INTRAVENOUS

## 2023-04-27 MED ORDER — PROPOFOL 10 MG/ML IV BOLUS
INTRAVENOUS | Status: AC
  Filled 2023-04-27: qty 20

## 2023-04-27 MED ORDER — ONDANSETRON HCL 4 MG/2ML IJ SOLN
INTRAMUSCULAR | Status: AC
  Filled 2023-04-27: qty 2

## 2023-04-27 SURGICAL SUPPLY — 48 items
ADH SKN CLS APL DERMABOND .7 (GAUZE/BANDAGES/DRESSINGS) ×1
APL PRP STRL LF DISP 70% ISPRP (MISCELLANEOUS) ×1
APPLIER CLIP 9.375 MED OPEN (MISCELLANEOUS)
APR CLP MED 9.3 20 MLT OPN (MISCELLANEOUS)
BINDER BREAST LRG (GAUZE/BANDAGES/DRESSINGS) IMPLANT
BINDER BREAST XLRG (GAUZE/BANDAGES/DRESSINGS) IMPLANT
BLADE SURG 15 STRL LF DISP TIS (BLADE) ×2 IMPLANT
BLADE SURG 15 STRL SS (BLADE) ×1
CANISTER SUC SOCK COL 7IN (MISCELLANEOUS) IMPLANT
CANISTER SUCT 1200ML W/VALVE (MISCELLANEOUS) IMPLANT
CHLORAPREP W/TINT 26 (MISCELLANEOUS) ×2 IMPLANT
CLIP APPLIE 9.375 MED OPEN (MISCELLANEOUS) IMPLANT
COVER BACK TABLE 60X90IN (DRAPES) ×2 IMPLANT
COVER MAYO STAND STRL (DRAPES) ×2 IMPLANT
COVER PROBE CYLINDRICAL 5X96 (MISCELLANEOUS) ×2 IMPLANT
DERMABOND ADVANCED .7 DNX12 (GAUZE/BANDAGES/DRESSINGS) ×2 IMPLANT
DRAPE LAPAROTOMY 100X72 PEDS (DRAPES) ×2 IMPLANT
DRAPE UTILITY XL STRL (DRAPES) ×2 IMPLANT
ELECT COATED BLADE 2.86 ST (ELECTRODE) ×2 IMPLANT
ELECT REM PT RETURN 9FT ADLT (ELECTROSURGICAL) ×1
ELECTRODE REM PT RTRN 9FT ADLT (ELECTROSURGICAL) ×2 IMPLANT
GLOVE BIOGEL PI IND STRL 7.0 (GLOVE) IMPLANT
GLOVE BIOGEL PI IND STRL 8 (GLOVE) ×2 IMPLANT
GLOVE ECLIPSE 8.0 STRL XLNG CF (GLOVE) ×2 IMPLANT
GLOVE SURG SS PI 7.0 STRL IVOR (GLOVE) IMPLANT
GOWN STRL REUS W/ TWL LRG LVL3 (GOWN DISPOSABLE) ×4 IMPLANT
GOWN STRL REUS W/ TWL XL LVL3 (GOWN DISPOSABLE) ×2 IMPLANT
GOWN STRL REUS W/TWL LRG LVL3 (GOWN DISPOSABLE) ×2
GOWN STRL REUS W/TWL XL LVL3 (GOWN DISPOSABLE) ×1
HEMOSTAT ARISTA ABSORB 3G PWDR (HEMOSTASIS) IMPLANT
HEMOSTAT SNOW SURGICEL 2X4 (HEMOSTASIS) IMPLANT
KIT MARKER MARGIN INK (KITS) ×2 IMPLANT
NDL HYPO 25X1 1.5 SAFETY (NEEDLE) ×2 IMPLANT
NEEDLE HYPO 25X1 1.5 SAFETY (NEEDLE) ×1 IMPLANT
NS IRRIG 1000ML POUR BTL (IV SOLUTION) ×2 IMPLANT
PACK BASIN DAY SURGERY FS (CUSTOM PROCEDURE TRAY) ×2 IMPLANT
PENCIL SMOKE EVACUATOR (MISCELLANEOUS) ×2 IMPLANT
SLEEVE SCD COMPRESS KNEE MED (STOCKING) ×2 IMPLANT
SPIKE FLUID TRANSFER (MISCELLANEOUS) IMPLANT
SPONGE T-LAP 4X18 ~~LOC~~+RFID (SPONGE) ×2 IMPLANT
SUT MNCRL AB 4-0 PS2 18 (SUTURE) ×2 IMPLANT
SUT SILK 2 0 SH (SUTURE) IMPLANT
SUT VICRYL 3-0 CR8 SH (SUTURE) ×2 IMPLANT
SYR CONTROL 10ML LL (SYRINGE) ×2 IMPLANT
TOWEL GREEN STERILE FF (TOWEL DISPOSABLE) ×2 IMPLANT
TRAY FAXITRON CT DISP (TRAY / TRAY PROCEDURE) ×2 IMPLANT
TUBE CONNECTING 20X1/4 (TUBING) IMPLANT
YANKAUER SUCT BULB TIP NO VENT (SUCTIONS) IMPLANT

## 2023-04-27 NOTE — Transfer of Care (Signed)
Immediate Anesthesia Transfer of Care Note  Patient: Morgan Anthony  Procedure(s) Performed: RIGHT BREAST LUMPECTOMY WITH RADIOACTIVE SEED LOCALIZATION (Right: Breast)  Patient Location: PACU  Anesthesia Type:General  Level of Consciousness: awake, alert , and patient cooperative  Airway & Oxygen Therapy: Patient Spontanous Breathing and Patient connected to face mask oxygen  Post-op Assessment: Report given to RN and Post -op Vital signs reviewed and stable  Post vital signs: Reviewed and stable  Last Vitals:  Vitals Value Taken Time  BP 128/77 04/27/23 1300  Temp    Pulse 66 04/27/23 1301  Resp 12 04/27/23 1301  SpO2 95 % 04/27/23 1301  Vitals shown include unvalidated device data.  Last Pain:  Vitals:   04/27/23 1039  TempSrc: Oral  PainSc: 0-No pain      Patients Stated Pain Goal: 5 (04/27/23 1039)  Complications: No notable events documented.

## 2023-04-27 NOTE — Op Note (Signed)
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  Core biopsy showed a cystic papillary metaplastic process and a mass.  Excision recommended due to potential upgrade risks.  Risks and benefits reviewed.  Observation reviewed.  Patient opted to have removed at this point time after discussion of all of her options including observation and long-term follow-up.The procedure has been discussed with the patient. Alternatives to surgery have been discussed with the patient.  Risks of surgery include bleeding,  Infection,  Seroma formation, death,  and the need for further surgery.   The patient understands and wishes to proceed.    Description of procedure: The patient was met in the holding area and questions answered.  Right breast was marked as the correct site.  Of note she underwent seed placement as an outpatient.  All questions were answered.  She was taken back to the operating room.  She is placed supine upon the operating room table.  After induction of general anesthesia, right breast was prepped and draped in sterile fashion timeout performed.  Local anesthetic infiltrated after using neoprobe to identify the seed.  Incision made in right upper outer quadrant of the breast.  Dissection was carried down and all tissue and the seed and clip were excised with a grossly negative margin.  Hemostasis achieved with cautery.  Irrigation used and local anesthetic infiltrated throughout.  The tissue was oriented with ink and the Faxitron image  revealed seed and clip to be present in the specimen.  The incision was then closed the deep layer with 3-0 Vicryl.  4 Monocryl used to close skin in a subcuticular fashion.  Dermabond applied.  All counts found to be correct.  Breast binder placed.  The patient was awoke extubated taken to recovery in satisfactory condition.

## 2023-04-27 NOTE — Discharge Instructions (Addendum)
[facility] Office Phone Number [phone]  BREAST BIOPSY/ PARTIAL MASTECTOMY: POST OP INSTRUCTIONS  Always review your discharge instruction sheet given to you by the facility where your surgery was performed.  IF YOU HAVE DISABILITY OR FAMILY LEAVE FORMS, YOU MUST BRING THEM TO THE OFFICE FOR PROCESSING.  DO NOT GIVE THEM TO YOUR DOCTOR.  A prescription for pain medication may be given to you upon discharge.  Take your pain medication as prescribed, if needed.  If narcotic pain medicine is not needed, then you may take acetaminophen (Tylenol) or ibuprofen (Advil) as needed. Take your usually prescribed medications unless otherwise directed If you need a refill on your pain medication, please contact your pharmacy.  They will contact our office to request authorization.  Prescriptions will not be filled after 5pm or on week-ends. You should eat very light the first 24 hours after surgery, such as soup, crackers, pudding, etc.  Resume your normal diet the day after surgery. Most patients will experience some swelling and bruising in the breast.  Ice packs and a good support bra will help.  Swelling and bruising can take several days to resolve.  It is common to experience some constipation if taking pain medication after surgery.  Increasing fluid intake and taking a stool softener will usually help or prevent this problem from occurring.  A mild laxative (Milk of Magnesia or Miralax) should be taken according to package directions if there are no bowel movements after 48 hours. Unless discharge instructions indicate otherwise, you may remove your bandages 24-48 hours after surgery, and you may shower at that time.  You may have steri-strips (small skin tapes) in place directly over the incision.  These strips should be left on the skin for 7-10 days.  If your surgeon used skin glue on the incision, you may shower in 24 hours.  The glue will flake off over the next 2-3 weeks.  Any  sutures or staples will be removed at the office during your follow-up visit. ACTIVITIES:  You may resume regular daily activities (gradually increasing) beginning the next day.  Wearing a good support bra or sports bra minimizes pain and swelling.  You may have sexual intercourse when it is comfortable. You may drive when you no longer are taking prescription pain medication, you can comfortably wear a seatbelt, and you can safely maneuver your car and apply brakes. RETURN TO WORK:  ______________________________________________________________________________________ You should see your doctor in the office for a follow-up appointment approximately two weeks after your surgery.  Your doctor's nurse will typically make your follow-up appointment when she calls you with your pathology report.  Expect your pathology report 2-3 business days after your surgery.  You may call to check if you do not hear from us after three days. OTHER INSTRUCTIONS: _______________________________________________________________________________________________ _____________________________________________________________________________________________________________________________________ _____________________________________________________________________________________________________________________________________ _____________________________________________________________________________________________________________________________________  WHEN TO CALL YOUR DOCTOR: Fever over 101.0 Nausea and/or vomiting. Extreme swelling or bruising. Continued bleeding from incision. Increased pain, redness, or drainage from the incision.  The clinic staff is available to answer your questions during regular business hours.  Please don't hesitate to call and ask to speak to one of the nurses for clinical concerns.  If you have a medical emergency, go to the nearest emergency room or call 911.  A surgeon from [facility] is always on call at the hospital.  For further questions, please visit [email or web address]    Post Anesthesia Home Care Instructions  Activity: Get plenty of rest for the remainder of   of the day. A responsible individual must stay with you for 24 hours following the procedure.  For the next 24 hours, DO NOT: -Drive a car -Advertising copywriter -Drink alcoholic beverages -Take any medication unless instructed by your physician -Make any legal decisions or sign important papers.  Meals: Start with liquid foods such as gelatin or soup. Progress to regular foods as tolerated. Avoid greasy, spicy, heavy foods. If nausea and/or vomiting occur, drink only clear liquids until the nausea and/or vomiting subsides. Call your physician if vomiting continues.  Special Instructions/Symptoms: Your throat may feel dry or sore from the anesthesia or the breathing tube placed in your throat during surgery. If this causes discomfort, gargle with warm salt water. The discomfort should disappear within 24 hours.  If you had a scopolamine patch placed behind your ear for the management of post- operative nausea and/or vomiting:  1. The medication in the patch is effective for 72 hours, after which it should be removed.  Wrap patch in a tissue and discard in the trash. Wash hands thoroughly with soap and water. 2. You may remove the patch earlier than 72 hours if you experience unpleasant side effects which may include dry mouth, dizziness or visual disturbances. 3. Avoid touching the patch. Wash your hands with soap and water after contact with the patch.    *May take Tylenol today at 4:45pm 04/27/23

## 2023-04-27 NOTE — Interval H&P Note (Signed)
History and Physical Interval Note:  04/27/2023 11:44 AM  Morgan Anthony  has presented today for surgery, with the diagnosis of RIGHT BREAST MASS.  The various methods of treatment have been discussed with the patient and family. After consideration of risks, benefits and other options for treatment, the patient has consented to  Procedure(s): RIGHT BREAST LUMPECTOMY WITH RADIOACTIVE SEED LOCALIZATION (Right) as a surgical intervention.  The patient's history has been reviewed, patient examined, no change in status, stable for surgery.  I have reviewed the patient's chart and labs.  Questions were answered to the patient's satisfaction.   The procedure has been discussed with the patient. Alternatives to surgery have been discussed with the patient.  Risks of surgery include bleeding,  Infection,  Seroma formation, death,  and the need for further surgery.   The patient understands and wishes to proceed.   Alex Leahy A Brook Geraci

## 2023-04-27 NOTE — H&P (Signed)
History of Present Illness: Morgan Anthony is a 67 y.o. female who is seen today as an office consultation for evaluation of Breast Problem  Patient presents for evaluation of right breast mass detected on recent mammogram. Area of density about 4 cm from the nipple in the central right breast core biopsy proven to be consistent with papillary apocrine metaplasia and sclerosis. No history of breast cancer. No other breast complaints.  Review of Systems: A complete review of systems was obtained from the patient. I have reviewed this information and discussed as appropriate with the patient. See HPI as well for other ROS.    Medical History: Past Medical History: Diagnosis Date Arthritis GERD (gastroesophageal reflux disease) Hypertension  There is no problem list on file for this patient.  Past Surgical History: Procedure Laterality Date CESAREAN SECTION CHOLECYSTECTOMY knee surgery right hip replacement   Allergies Allergen Reactions Losartan Anaphylaxis and Shortness Of Breath Trimethoprim Swelling  Current Outpatient Medications on File Prior to Visit Medication Sig Dispense Refill ALPRAZolam (XANAX) 0.5 MG tablet TAKE ONE TABLET (0.5 MG DOSE) BY MOUTH DAILY AS NEEDED FOR SLEEP. atenoloL (TENORMIN) 50 MG tablet TAKE 1 TABLET EVERY MORNING AND 1/2 TABLET EVERY EVENING AS DIRECTED celecoxib (CELEBREX) 200 MG capsule TAKE 1 CAPSULE EVERY DAY AS NEEDED FOR PAIN cephalexin (KEFLEX) 250 MG capsule Take by mouth ergocalciferol, vitamin D2, 1,250 mcg (50,000 unit) capsule TAKE 1 CAPSULE ONCE WEEKLY FOR 3 WEEKS PER MONTH ONLY esomeprazole (NEXIUM) 40 MG DR capsule Take by mouth hydroCHLOROthiazide (HYDRODIURIL) 25 MG tablet Take 25 mg by mouth once daily MYRBETRIQ 50 mg ER tablet Take 50 mg by mouth once daily YUVAFEM 10 mcg vaginal tablet INSERT 1 TABLET TWO TIMES A WEEK VAGINALLY  No current facility-administered medications on file prior to visit.  Family History Problem  Relation Age of Onset High blood pressure (Hypertension) Mother Skin cancer Father High blood pressure (Hypertension) Father   Social History  Tobacco Use Smoking Status Never Smokeless Tobacco Never   Social History  Socioeconomic History Marital status: Married Tobacco Use Smoking status: Never Smokeless tobacco: Never  Social Determinants of Health  Financial Resource Strain: Low Risk (10/28/2022) Received from [facility] Overall Financial Resource Strain (CARDIA) Difficulty of Paying Living Expenses: Not hard at all Food Insecurity: No Food Insecurity (10/28/2022) Received from [facility], [facility] Hunger Vital Sign Worried About Running Out of Food in the Last Year: Never true Ran Out of Food in the Last Year: Never true Transportation Needs: No Transportation Needs (10/27/2021) Received from [employer], [facility] PRAPARE - Transportation Lack of Transportation (Medical): No Lack of Transportation (Non-Medical): No Physical Activity: Unknown (10/28/2022) Received from [facility], [facility] Exercise Vital Sign Days of Exercise per Week: 0 days Stress: No Stress Concern Present (10/28/2022) Received from [facility], [facility] of Occupational Health - Occupational Stress Questionnaire Feeling of Stress : Not at all Social Connections: Socially Integrated (10/28/2022) Received from [facility], [facility] Social Network How would you rate your social network (family, work, friends)?: Good participation with social networks Housing Stability: Low Risk (10/27/2021) Received from [facility], [facility] Housing Stability Vital Sign Unable to Pay for Housing in the Last Year: No Number of Places Lived in the Last Year: 1 In the last 12 months, was there a time when you did not have a steady place to sleep or slept in a shelter (including now)?: No  Objective:  Vitals: 03/29/23 1349 BP:  (!) 142/86 Pulse:  71 Weight: 71.6 kg (157 lb 12.8 oz) Height: 160 cm (5\' 3" ) PainSc: 0-No pain  Body mass index is 27.95 kg/m.  Physical Exam Exam conducted with a chaperone present. HENT: Head: Normocephalic. Cardiovascular: Rate and Rhythm: Normal rate. Pulmonary: Effort: Pulmonary effort is normal. Chest: Breasts: Right: Inverted nipple present. No mass or nipple discharge. Left: Normal. No mass or nipple discharge. Musculoskeletal: General: Normal range of motion. Lymphadenopathy: Upper Body: Right upper body: No supraclavicular or axillary adenopathy. Left upper body: No supraclavicular or axillary adenopathy. Skin: General: Skin is warm. Neurological: General: No focal deficit present. Mental Status: She is alert. Psychiatric: Mood and Affect: Mood normal. Behavior: Behavior normal.    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 Pathologist, Electronic Signature (Case signed 03/03/2023) Assessment and Plan:  Diagnoses and all orders for this visit:  Subareolar mass of right breast   Discussed the pros and cons of lumpectomy. She be more comfortable having lumpectomy grew the area even though the likelihood of upgrade will be less than 20%. Discussed right breast seed lumpectomy to her. Risk and benefits reviewed. She is agreed to proceed. Risk of bleeding, infection, cosmetic deformity, numbness and nipple and/or nipple loss, anesthesia risk, and the need for the treatments or procedures discussed today.   Hayden Rasmussen, MD

## 2023-04-27 NOTE — Anesthesia Procedure Notes (Signed)
Procedure Name: LMA Insertion Date/Time: 04/27/2023 12:18 PM  Performed by: Karen Kitchens, CRNAPre-anesthesia Checklist: Patient identified, Emergency Drugs available, Suction available and Patient being monitored Patient Re-evaluated:Patient Re-evaluated prior to induction Oxygen Delivery Method: Circle system utilized Preoxygenation: Pre-oxygenation with 100% oxygen Induction Type: IV induction Ventilation: Mask ventilation without difficulty LMA: LMA inserted LMA Size: 4.0 Number of attempts: 1 Airway Equipment and Method: Bite block Placement Confirmation: positive ETCO2, CO2 detector and breath sounds checked- equal and bilateral Tube secured with: Tape Dental Injury: Teeth and Oropharynx as per pre-operative assessment

## 2023-04-27 NOTE — Anesthesia Postprocedure Evaluation (Signed)
Anesthesia Post Note  Patient: Morgan Anthony  Procedure(s) Performed: RIGHT BREAST LUMPECTOMY WITH RADIOACTIVE SEED LOCALIZATION (Right: Breast)     Patient location during evaluation: PACU Anesthesia Type: General Level of consciousness: awake Pain management: pain level controlled Vital Signs Assessment: post-procedure vital signs reviewed and stable Respiratory status: spontaneous breathing, nonlabored ventilation and respiratory function stable Cardiovascular status: blood pressure returned to baseline and stable Postop Assessment: no apparent nausea or vomiting Anesthetic complications: no   No notable events documented.  Last Vitals:  Vitals:   04/27/23 1345 04/27/23 1405  BP: 124/77 139/71  Pulse: 62 (!) 53  Resp: 18 18  Temp:  (!) 36.3 C  SpO2: 98% 99%    Last Pain:  Vitals:   04/27/23 1405  TempSrc:   PainSc: 2                  Linton Rump

## 2023-04-28 ENCOUNTER — Encounter (HOSPITAL_BASED_OUTPATIENT_CLINIC_OR_DEPARTMENT_OTHER): Payer: Self-pay | Admitting: Surgery

## 2023-04-30 LAB — SURGICAL PATHOLOGY

## 2023-05-03 ENCOUNTER — Encounter: Payer: Self-pay | Admitting: Surgery

## 2023-05-20 ENCOUNTER — Telehealth: Payer: Self-pay

## 2023-05-20 ENCOUNTER — Ambulatory Visit: Payer: Medicare PPO | Admitting: Physician Assistant

## 2023-05-20 DIAGNOSIS — M170 Bilateral primary osteoarthritis of knee: Secondary | ICD-10-CM

## 2023-05-20 NOTE — Progress Notes (Signed)
Office Visit Note   Patient: Morgan Anthony           Date of Birth: 03-04-1956           MRN: [id number] Visit Date: 05/20/2023              Requested by: Eartha Inch, MD [address] [place],  [place] [address] PCP: Eartha Inch, MD  No chief complaint on file.     HPI: Morgan Anthony is a pleasant 67 year old woman who have seen in the past for arthritis of her bilateral knees.  She last had injections of steroid a couple months ago.  She says she has gotten good relief but still has some residual pain.  She comes in today to discuss viscosupplementation  Assessment & Plan: Visit Diagnoses:  1. Primary osteoarthritis of both knees     Plan: Patient would like to be forward with gel injections we will authorize this and she would just like to do the right knee at first so we will authorize only for the right knee she will follow-up when these have been preapproved  Follow-Up Instructions: No follow-ups on file.   Ortho Exam  Patient is alert, oriented, no adenopathy, well-dressed, normal affect, normal respiratory effort. Bilateral knees no effusion no erythema compartments are soft and nontender negative Denna Haggard' sign she does have some grinding with range of motion This patient is diagnosed with osteoarthritis of the knee(s).    Radiographs show evidence of joint space narrowing, osteophytes, subchondral sclerosis and/or subchondral cysts.  This patient has knee pain which interferes with functional and activities of daily living.    This patient has experienced inadequate response, adverse effects and/or intolerance with conservative treatments such as acetaminophen, NSAIDS, topical creams, physical therapy or regular exercise, knee bracing and/or weight loss.   This patient has experienced inadequate response or has a contraindication to intra articular steroid injections for at least 3 months.   This patient is not scheduled to have a total knee  replacement within 6 months of starting treatment with viscosupplementation.   Imaging: No results found. No images are attached to the encounter.  Labs: No results found for: "HGBA1C", "ESRSEDRATE", "CRP", "LABURIC", "REPTSTATUS", "GRAMSTAIN", "CULT", "LABORGA"   No results found for: "ALBUMIN", "PREALBUMIN", "CBC"  No results found for: "MG" No results found for: "VD25OH"  No results found for: "PREALBUMIN"    Latest Ref Rng & Units 02/19/2019    3:54 AM 02/18/2019    3:22 AM 02/15/2019   11:53 AM  CBC EXTENDED  WBC 4.0 - 10.5 K/uL 5.9  6.8  6.7   RBC 3.87 - 5.11 MIL/uL 3.04  3.34  4.76   Hemoglobin 12.0 - 15.0 g/dL 9.2  84.6  96.2   HCT 95.2 - 46.0 % 29.3  32.5  44.2   Platelets 150 - 400 K/uL 141  177  278      There is no height or weight on file to calculate BMI.  Orders:  No orders of the defined types were placed in this encounter.  No orders of the defined types were placed in this encounter.    Procedures: No procedures performed  Clinical Data: No additional findings.  ROS:  All other systems negative, except as noted in the HPI. Review of Systems  Objective: Vital Signs: There were no vitals taken for this visit.  Specialty Comments:  No specialty comments available.  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   Past Medical History:  Diagnosis Date   Cough    GERD (gastroesophageal reflux disease)    Hiatal hernia    History of kidney stones    History of urethritis    Hypertension    OA (osteoarthritis)    knees and hips    Family History  Problem Relation Age of Onset   Colon cancer Neg Hx    Esophageal cancer Neg Hx    Rectal cancer Neg Hx    Stomach cancer Neg Hx     Past Surgical History:  Procedure Laterality Date   BREAST LUMPECTOMY WITH RADIOACTIVE SEED LOCALIZATION Right 04/27/2023   Procedure:  RIGHT BREAST LUMPECTOMY WITH RADIOACTIVE SEED LOCALIZATION;  Surgeon: Harriette Bouillon, MD;  [facility]:  [facility];  Service: General;  Laterality: Right;   CESAREAN SECTION  1985   COLONOSCOPY WITH PROPOFOL  01/2019   EXTRACORPOREAL SHOCK WAVE LITHOTRIPSY  1993;  07-25-2015  @[facility]  by dr Brunilda Payor   KNEE ARTHROSCOPY Bilateral 2007   LAPAROSCOPIC CHOLECYSTECTOMY  2007   TOTAL HIP ARTHROPLASTY Right 02/17/2019   Procedure: RIGHT TOTAL HIP ARTHROPLASTY ANTERIOR APPROACH;  Surgeon: Kathryne Hitch, MD;  Location: [facility];  Service: Orthopedics;  Laterality: Right;   Social History   Occupational History   Not on file  Tobacco Use   Smoking status: Never   Smokeless tobacco: Never  Substance and Sexual Activity   Alcohol use: No   Drug use: Never   Sexual activity: Not on file

## 2023-05-20 NOTE — Telephone Encounter (Signed)
VOB submitted for Monovisc, right knee  

## 2023-05-20 NOTE — Telephone Encounter (Signed)
-----   Message from Samuella Cota, [place] sent at 05/20/2023 11:12 AM EDT ----- Can you get authorizaton for visco gel injection right knee for this pt per MAP. htanks

## 2023-06-10 ENCOUNTER — Telehealth (HOSPITAL_BASED_OUTPATIENT_CLINIC_OR_DEPARTMENT_OTHER): Payer: Self-pay

## 2023-06-10 NOTE — Telephone Encounter (Signed)
Approved for Monovisc-Right knee B&B $40 copay Covered @ 100% once OOP is met No prior auth required

## 2023-06-10 NOTE — Telephone Encounter (Signed)
Called and scheduled

## 2023-07-06 ENCOUNTER — Ambulatory Visit: Payer: Medicare PPO | Admitting: Physician Assistant

## 2023-07-06 ENCOUNTER — Encounter: Payer: Self-pay | Admitting: Physician Assistant

## 2023-07-06 DIAGNOSIS — M1711 Unilateral primary osteoarthritis, right knee: Secondary | ICD-10-CM | POA: Diagnosis not present

## 2023-07-06 MED ORDER — HYALURONAN 88 MG/4ML IX SOSY
88.0000 mg | PREFILLED_SYRINGE | INTRA_ARTICULAR | Status: AC | PRN
Start: 2023-07-06 — End: 2023-07-06
  Administered 2023-07-06: 88 mg via INTRA_ARTICULAR

## 2023-07-06 NOTE — Progress Notes (Signed)
Office Visit Note   Patient: Morgan Anthony           Date of Birth: 04/08/1956           MRN: [id number] Visit Date: 07/06/2023              Requested by: Eartha Inch, MD [address] [place],  [place] [address] PCP: Eartha Inch, MD  No chief complaint on file.     HPI: Pleasant 67 year old woman comes in for her Monovisc injection into her right knee.  She has moderate right knee arthritis which did not have complete resolution of her symptoms with intra-articular steroid injection Visit Diagnoses: Osteoarthritis bilateral knees  Plan: Will go forward with Monovisc injection today she tolerated it quite well discussed the site possible side effects.  Would like to see how this does and would consider viscosupplementation on her left knee which also has arthritis in the future  Follow-Up Instructions: Return if symptoms worsen or fail to improve.   Ortho Exam  Patient is alert, oriented, no adenopathy, well-dressed, normal affect, normal respiratory effort. Right knee with no erythema no effusion she is neurovascular intact compartments are soft and nontender  Imaging: No results found. No images are attached to the encounter.  Labs: No results found for: "HGBA1C", "ESRSEDRATE", "CRP", "LABURIC", "REPTSTATUS", "GRAMSTAIN", "CULT", "LABORGA"   No results found for: "ALBUMIN", "PREALBUMIN", "CBC"  No results found for: "MG" No results found for: "VD25OH"  No results found for: "PREALBUMIN"    Latest Ref Rng & Units 02/19/2019    3:54 AM 02/18/2019    3:22 AM 02/15/2019   11:53 AM  CBC EXTENDED  WBC 4.0 - 10.5 K/uL 5.9  6.8  6.7   RBC 3.87 - 5.11 MIL/uL 3.04  3.34  4.76   Hemoglobin 12.0 - 15.0 g/dL 9.2  52.8  41.3   HCT 24.4 - 46.0 % 29.3  32.5  44.2   Platelets 150 - 400 K/uL 141  177  278      There is no height or weight on file to calculate BMI.  Orders:  No orders of the defined types were placed in this encounter.  No orders of  the defined types were placed in this encounter.    Procedures: Large Joint Inj: R knee on 07/06/2023 10:48 AM Indications: pain and diagnostic evaluation Details: 1.5 in anteromedial approach  Arthrogram: No  Medications: 88 mg Hyaluronan 88 MG/4ML Outcome: tolerated well, no immediate complications Procedure, treatment alternatives, risks and benefits explained, specific risks discussed. Consent was given by the patient.    Clinical Data: No additional findings.  ROS:  All other systems negative, except as noted in the HPI. Review of Systems  Objective: Vital Signs: There were no vitals taken for this visit.  Specialty Comments:  No specialty comments available.  PMFS History: Patient Active Problem List   Diagnosis Date Noted  . Osteoarthritis of knees, bilateral 05/20/2023  . 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 Cough   . GERD (gastroesophageal reflux disease)   . Hiatal hernia   . History of kidney stones   . History of urethritis   . Hypertension   . OA (osteoarthritis)    knees and hips    Family History  Problem Relation Age of Onset  . Colon cancer Neg Hx   . Esophageal cancer Neg Hx   . Rectal cancer Neg  Hx   . Stomach cancer Neg Hx     Past Surgical History:  Procedure Laterality Date  . BREAST LUMPECTOMY WITH RADIOACTIVE SEED LOCALIZATION Right 04/27/2023   Procedure: RIGHT BREAST LUMPECTOMY WITH RADIOACTIVE SEED LOCALIZATION;  Surgeon: Harriette Bouillon, MD;  Location: [facility];  Service: General;  Laterality: Right;  . CESAREAN SECTION  1985  . COLONOSCOPY WITH PROPOFOL  01/2019  . EXTRACORPOREAL SHOCK WAVE LITHOTRIPSY  1993;  07-25-2015  @[facility]  by dr Brunilda Payor  . KNEE ARTHROSCOPY Bilateral 2007  . LAPAROSCOPIC CHOLECYSTECTOMY  2007  . TOTAL HIP ARTHROPLASTY Right 02/17/2019   Procedure: RIGHT TOTAL HIP ARTHROPLASTY ANTERIOR APPROACH;  Surgeon: Kathryne Hitch, MD;   Location: [facility];  Service: Orthopedics;  Laterality: Right;   Social History   Occupational History  . Not on file  Tobacco Use  . Smoking status: Never  . Smokeless tobacco: Never  Substance and Sexual Activity  . Alcohol use: No  . Drug use: Never  . Sexual activity: Not on file

## 2024-02-14 ENCOUNTER — Ambulatory Visit: Payer: Medicare PPO | Admitting: Physician Assistant

## 2024-02-14 ENCOUNTER — Encounter: Payer: Self-pay | Admitting: Physician Assistant

## 2024-02-14 DIAGNOSIS — M170 Bilateral primary osteoarthritis of knee: Secondary | ICD-10-CM

## 2024-02-14 MED ORDER — METHYLPREDNISOLONE ACETATE 40 MG/ML IJ SUSP
40.0000 mg | INTRAMUSCULAR | Status: AC | PRN
Start: 2024-02-14 — End: 2024-02-14
  Administered 2024-02-14: 40 mg via INTRA_ARTICULAR

## 2024-02-14 MED ORDER — LIDOCAINE HCL 1 % IJ SOLN
3.0000 mL | INTRAMUSCULAR | Status: AC | PRN
Start: 2024-02-14 — End: 2024-02-14
  Administered 2024-02-14: 3 mL

## 2024-02-14 NOTE — Progress Notes (Signed)
 Office Visit Note   Patient: Morgan Anthony           Date of Birth: 1955/12/31           MRN: [id number] Visit Date: 02/14/2024              Requested by: Eartha Inch, MD [address] [place],  [place] [address] PCP: Eartha Inch, MD  Chief Complaint  Patient presents with  . Right Knee - Pain  . Left Knee - Pain      HPI: Patient is a pleasant 68 year old woman with a history of osteoarthritis of her knees she has had both steroid and viscosupplementation.  She did get a lot of relief from the viscosupplementation she did fall onto her knees about a month ago but is recovering would like injections today  Assessment & Plan: Visit Diagnoses:  1. Primary osteoarthritis of both knees     Plan: Will go forward with injections today and authorize for viscosupplementation This patient is diagnosed with osteoarthritis of the knee(s).    Radiographs show evidence of joint space narrowing, osteophytes, subchondral sclerosis and/or subchondral cysts.  This patient has knee pain which interferes with functional and activities of daily living.    This patient has experienced inadequate response, adverse effects and/or intolerance with conservative treatments such as acetaminophen, NSAIDS, topical creams, physical therapy or regular exercise, knee bracing and/or weight loss.   This patient has experienced inadequate response or has a contraindication to intra articular steroid injections for at least 3 months.   This patient is not scheduled to have a total knee replacement within 6 months of starting treatment with viscosupplementation.   Follow-Up Instructions: Osteoarthritis bilateral knees  Ortho Exam  Patient is alert, oriented, no adenopathy, well-dressed, normal affect, normal respiratory effort. Bilateral knees no effusion no erythema she does have some crepitus with range of motion she is neurovascularly intact no evidence of  infection  Imaging: No results found. No images are attached to the encounter.  Labs: No results found for: "HGBA1C", "ESRSEDRATE", "CRP", "LABURIC", "REPTSTATUS", "GRAMSTAIN", "CULT", "LABORGA"   No results found for: "ALBUMIN", "PREALBUMIN", "CBC"  No results found for: "MG" No results found for: "VD25OH"  No results found for: "PREALBUMIN"    Latest Ref Rng & Units 02/19/2019    3:54 AM 02/18/2019    3:22 AM 02/15/2019   11:53 AM  CBC EXTENDED  WBC 4.0 - 10.5 K/uL 5.9  6.8  6.7   RBC 3.87 - 5.11 MIL/uL 3.04  3.34  4.76   Hemoglobin 12.0 - 15.0 g/dL 9.2  08.6  57.8   HCT 46.9 - 46.0 % 29.3  32.5  44.2   Platelets 150 - 400 K/uL 141  177  278      There is no height or weight on file to calculate BMI.  Orders:  No orders of the defined types were placed in this encounter.  No orders of the defined types were placed in this encounter.    Procedures: Large Joint Inj: bilateral knee on 02/14/2024 2:13 PM Indications: pain and diagnostic evaluation Details: 25 G 1.5 in needle, anteromedial approach  Arthrogram: No  Medications (Right): 3 mL lidocaine 1 %; 40 mg methylPREDNISolone acetate 40 MG/ML Medications (Left): 3 mL lidocaine 1 %; 40 mg methylPREDNISolone acetate 40 MG/ML Outcome: tolerated well, no immediate complications Procedure, treatment alternatives, risks and benefits explained, specific risks discussed. Consent was given by the patient.    Clinical  Data: No additional findings.  ROS:  All other systems negative, except as noted in the HPI. Review of Systems  Objective: Vital Signs: There were no vitals taken for this visit.  Specialty Comments:  No specialty comments available.  PMFS History: Patient Active Problem List   Diagnosis Date Noted  . Osteoarthritis of knees, bilateral 05/20/2023  . 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 Cough   . GERD (gastroesophageal reflux disease)    . Hiatal hernia   . History of kidney stones   . History of urethritis   . Hypertension   . OA (osteoarthritis)    knees and hips    Family History  Problem Relation Age of Onset  . Colon cancer Neg Hx   . Esophageal cancer Neg Hx   . Rectal cancer Neg Hx   . Stomach cancer Neg Hx     Past Surgical History:  Procedure Laterality Date  . BREAST LUMPECTOMY WITH RADIOACTIVE SEED LOCALIZATION Right 04/27/2023   Procedure: RIGHT BREAST LUMPECTOMY WITH RADIOACTIVE SEED LOCALIZATION;  Surgeon: Harriette Bouillon, MD;  Location: [facility];  Service: General;  Laterality: Right;  . CESAREAN SECTION  1985  . COLONOSCOPY WITH PROPOFOL  01/2019  . EXTRACORPOREAL SHOCK WAVE LITHOTRIPSY  1993;  07-25-2015  @[facility]  by dr Brunilda Payor  . KNEE ARTHROSCOPY Bilateral 2007  . LAPAROSCOPIC CHOLECYSTECTOMY  2007  . TOTAL HIP ARTHROPLASTY Right 02/17/2019   Procedure: RIGHT TOTAL HIP ARTHROPLASTY ANTERIOR APPROACH;  Surgeon: Kathryne Hitch, MD;  Location: [facility];  Service: Orthopedics;  Laterality: Right;   Social History   Occupational History  . Not on file  Tobacco Use  . Smoking status: Never  . Smokeless tobacco: Never  Substance and Sexual Activity  . Alcohol use: No  . Drug use: Never  . Sexual activity: Not on file

## 2024-03-01 ENCOUNTER — Telehealth: Payer: Self-pay

## 2024-03-01 NOTE — Telephone Encounter (Signed)
 VOB submitted for Monovisc, bilateral knee

## 2024-03-01 NOTE — Telephone Encounter (Signed)
-----   Message from [place] W sent at 02/14/2024  2:15 PM EST ----- Regarding: Gel injections bilateral knees Hey Aspyn Warnke,  Could we please get authorization for gel injections for this patient? Bilateral knees.  Thanks, Isabelle Course

## 2024-06-30 ENCOUNTER — Encounter: Payer: Self-pay | Admitting: Advanced Practice Midwife

## 2024-10-16 ENCOUNTER — Encounter: Payer: Self-pay | Admitting: Radiology

## 2024-10-27 ENCOUNTER — Ambulatory Visit: Admitting: Physician Assistant

## 2024-10-27 ENCOUNTER — Encounter: Payer: Self-pay | Admitting: Physician Assistant

## 2024-10-27 DIAGNOSIS — M170 Bilateral primary osteoarthritis of knee: Secondary | ICD-10-CM

## 2024-10-27 MED ORDER — METHYLPREDNISOLONE ACETATE 40 MG/ML IJ SUSP
40.0000 mg | INTRAMUSCULAR | Status: AC | PRN
Start: 2024-10-27 — End: 2024-10-27
  Administered 2024-10-27: 40 mg via INTRA_ARTICULAR

## 2024-10-27 MED ORDER — LIDOCAINE HCL 1 % IJ SOLN
4.0000 mL | INTRAMUSCULAR | Status: AC | PRN
Start: 2024-10-27 — End: 2024-10-27
  Administered 2024-10-27: 4 mL

## 2024-10-27 NOTE — Progress Notes (Signed)
 Office Visit Note   Patient: Morgan Anthony           Date of Birth: 05-26-1956           MRN: [id number] Visit Date: 10/27/2024              Requested by: Sophronia Ozell BROCKS, MD [address] [place],  [place] [address] PCP: Sophronia Ozell BROCKS, MD      HPI: Patient presents today with a history of bilateral osteoarthritis of her knees.  She has had both viscosupplementation in the past and steroid the viscosupplementation really helps her a lot no new injury  Assessment & Plan: Visit Diagnoses:  1. Primary osteoarthritis of both knees     Plan: Went forward with bilateral steroid injections today.  Will request authorization for bilateral Visco supplementation This patient is diagnosed with osteoarthritis of the knee(s).    Radiographs show evidence of joint space narrowing, osteophytes, subchondral sclerosis and/or subchondral cysts.  This patient has knee pain which interferes with functional and activities of daily living.    This patient has experienced inadequate response, adverse effects and/or intolerance with conservative treatments such as acetaminophen , NSAIDS, topical creams, physical therapy or regular exercise, knee bracing and/or weight loss.   This patient has experienced inadequate response or has a contraindication to intra articular steroid injections for at least 3 months.   This patient is not scheduled to have a total knee replacement within 6 months of starting treatment with viscosupplementation.   Follow-Up Instructions: No follow-ups on file.   Ortho Exam  Patient is alert, oriented, no adenopathy, well-dressed, normal affect, normal respiratory effort. Bilateral knees no effusion no erythema compartments are soft and nontender neurovascular intact no evidence cellulitis or infection    Imaging: No results found. No images are attached to the encounter.  Labs: No results found for: HGBA1C, ESRSEDRATE, CRP, LABURIC,  REPTSTATUS, GRAMSTAIN, CULT, LABORGA   No results found for: ALBUMIN, PREALBUMIN, CBC  No results found for: MG No results found for: VD25OH  No results found for: PREALBUMIN    Latest Ref Rng & Units 02/19/2019    3:54 AM 02/18/2019    3:22 AM 02/15/2019   11:53 AM  CBC EXTENDED  WBC 4.0 - 10.5 K/uL 5.9  6.8  6.7   RBC 3.87 - 5.11 MIL/uL 3.04  3.34  4.76   Hemoglobin 12.0 - 15.0 g/dL 9.2  89.9  85.4   HCT 63.9 - 46.0 % 29.3  32.5  44.2   Platelets 150 - 400 K/uL 141  177  278      There is no height or weight on file to calculate BMI.  Orders:  No orders of the defined types were placed in this encounter.  No orders of the defined types were placed in this encounter.    Procedures: Large Joint Inj: bilateral knee on 10/27/2024 10:51 AM Indications: pain and diagnostic evaluation Details: 25 G 1.5 in needle, anteromedial approach  Arthrogram: No  Medications (Right): 4 mL lidocaine  1 %; 40 mg methylPREDNISolone  acetate 40 MG/ML Medications (Left): 4 mL lidocaine  1 %; 40 mg methylPREDNISolone  acetate 40 MG/ML Outcome: tolerated well, no immediate complications Procedure, treatment alternatives, risks and benefits explained, specific risks discussed. Consent was given by the patient.      Clinical Data: No additional findings.  ROS:  All other systems negative, except as noted in the HPI. Review of Systems  Objective: Vital Signs: There were no vitals taken  for this visit.  Specialty Comments:  No specialty comments available.  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   Past Medical History:  Diagnosis Date   Cough    GERD (gastroesophageal reflux disease)    Hiatal hernia    History of kidney stones    History of urethritis    Hypertension    OA (osteoarthritis)    knees and hips    Family History   Problem Relation Age of Onset   Colon cancer Neg Hx    Esophageal cancer Neg Hx    Rectal cancer Neg Hx    Stomach cancer Neg Hx     Past Surgical History:  Procedure Laterality Date   BREAST LUMPECTOMY WITH RADIOACTIVE SEED LOCALIZATION Right 04/27/2023   Procedure: RIGHT BREAST LUMPECTOMY WITH RADIOACTIVE SEED LOCALIZATION;  Surgeon: Vanderbilt Ned, MD;  Location: [facility];  Service: General;  Laterality: Right;   CESAREAN SECTION  1985   COLONOSCOPY WITH PROPOFOL   01/2019   EXTRACORPOREAL SHOCK WAVE LITHOTRIPSY  1993;  07-25-2015  @[facility]  by dr aleene   KNEE ARTHROSCOPY Bilateral 2007   LAPAROSCOPIC CHOLECYSTECTOMY  2007   TOTAL HIP ARTHROPLASTY Right 02/17/2019   Procedure: RIGHT TOTAL HIP ARTHROPLASTY ANTERIOR APPROACH;  Surgeon: Vernetta Lonni GRADE, MD;  Location: [facility];  Service: Orthopedics;  Laterality: Right;   Social History   Occupational History   Not on file  Tobacco Use   Smoking status: Never   Smokeless tobacco: Never  Substance and Sexual Activity   Alcohol use: No   Drug use: Never   Sexual activity: Not on file

## 2024-12-27 ENCOUNTER — Telehealth: Payer: Self-pay | Admitting: Physician Assistant

## 2024-12-27 NOTE — Telephone Encounter (Signed)
 Patient called. Would like to know if there gel injections are approved?

## 2024-12-27 NOTE — Telephone Encounter (Signed)
 Will call.

## 2024-12-28 NOTE — Telephone Encounter (Signed)
 Appt. Scheduled for gel injection

## 2025-01-09 ENCOUNTER — Ambulatory Visit: Admitting: Physician Assistant

## 2025-01-09 ENCOUNTER — Encounter: Payer: Self-pay | Admitting: Physician Assistant

## 2025-01-09 DIAGNOSIS — M170 Bilateral primary osteoarthritis of knee: Secondary | ICD-10-CM | POA: Diagnosis not present

## 2025-01-09 MED ORDER — HYALURONAN 88 MG/4ML IX SOSY
88.0000 mg | PREFILLED_SYRINGE | INTRA_ARTICULAR | Status: AC | PRN
  Administered 2025-01-09: 88 mg via INTRA_ARTICULAR

## 2025-01-09 NOTE — Progress Notes (Signed)
 "  Office Visit Note   Patient: Morgan Anthony           Date of Birth: 10/01/1956           MRN: [id number] Visit Date: 01/09/2025              Requested by: Sophronia Ozell BROCKS, MD [address] [place],  [place] [address] PCP: Sophronia Ozell BROCKS, MD  No chief complaint on file.     HPI: Is a pleasant 69 year old woman who comes in today for bilateral Monovisc injections.  She has a history of osteoarthritis of her knees and periodically gets steroid injections which she can have again after February.  She has had Monovisc in the past without difficulty  Assessment & Plan: Visit Diagnoses:  1. Primary osteoarthritis of both knees     Plan: Went forward with injections without difficulty May follow-up for steroid injections after February  Follow-Up Instructions: Return if symptoms worsen or fail to improve.   Ortho Exam  Patient is alert, oriented, no adenopathy, well-dressed, normal affect, normal respiratory effort. Bilateral knees no effusion no erythema compartments are soft and compressible she is neurovascular intact    Imaging: No results found. No images are attached to the encounter.  Labs: No results found for: HGBA1C, ESRSEDRATE, CRP, LABURIC, REPTSTATUS, GRAMSTAIN, CULT, LABORGA   No results found for: ALBUMIN, PREALBUMIN, CBC  No results found for: MG No results found for: VD25OH  No results found for: PREALBUMIN    Latest Ref Rng & Units 02/19/2019    3:54 AM 02/18/2019    3:22 AM 02/15/2019   11:53 AM  CBC EXTENDED  WBC 4.0 - 10.5 K/uL 5.9  6.8  6.7   RBC 3.87 - 5.11 MIL/uL 3.04  3.34  4.76   Hemoglobin 12.0 - 15.0 g/dL 9.2  89.9  85.4   HCT 63.9 - 46.0 % 29.3  32.5  44.2   Platelets 150 - 400 K/uL 141  177  278      There is no height or weight on file to calculate BMI.  Orders:  No orders of the defined types were placed in this encounter.  No orders of the defined types were placed in this  encounter.    Procedures: Large Joint Inj: bilateral knee on 01/09/2025 1:55 PM Indications: pain and diagnostic evaluation Details: 22 G 1.5 in needle, anteromedial approach  Arthrogram: No  Medications (Right): 88 mg Hyaluronan 88 MG/4ML Medications (Left): 88 mg Hyaluronan 88 MG/4ML Outcome: tolerated well, no immediate complications Procedure, treatment alternatives, risks and benefits explained, specific risks discussed. Consent was given by the patient.     Clinical Data: No additional findings.  ROS:  All other systems negative, except as noted in the HPI. Review of Systems  Objective: Vital Signs: There were no vitals taken for this visit.  Specialty Comments:  No specialty comments available.  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   Past Medical History:  Diagnosis Date   Cough    GERD (gastroesophageal reflux disease)    Hiatal hernia    History of kidney stones    History of urethritis    Hypertension    OA (osteoarthritis)    knees and hips    Family History  Problem Relation Age of Onset   Colon cancer Neg Hx    Esophageal  cancer Neg Hx    Rectal cancer Neg Hx    Stomach cancer Neg Hx     Past Surgical History:  Procedure Laterality Date   BREAST LUMPECTOMY WITH RADIOACTIVE SEED LOCALIZATION Right 04/27/2023   Procedure: RIGHT BREAST LUMPECTOMY WITH RADIOACTIVE SEED LOCALIZATION;  Surgeon: Vanderbilt Ned, MD;  Location: [facility];  Service: General;  Laterality: Right;   CESAREAN SECTION  1985   COLONOSCOPY WITH PROPOFOL   01/2019   EXTRACORPOREAL SHOCK WAVE LITHOTRIPSY  1993;  07-25-2015  @[facility]  by dr aleene   KNEE ARTHROSCOPY Bilateral 2007   LAPAROSCOPIC CHOLECYSTECTOMY  2007   TOTAL HIP ARTHROPLASTY Right 02/17/2019   Procedure: RIGHT TOTAL HIP ARTHROPLASTY ANTERIOR APPROACH;   Surgeon: Vernetta Lonni GRADE, MD;  Location: [facility];  Service: Orthopedics;  Laterality: Right;   Social History   Occupational History   Not on file  Tobacco Use   Smoking status: Never   Smokeless tobacco: Never  Substance and Sexual Activity   Alcohol use: No   Drug use: Never   Sexual activity: Not on file       "
